# Patient Record
Sex: Male | Born: 1970 | Race: Black or African American | Hispanic: No | Marital: Single | Smoking: Current every day smoker
Health system: Southern US, Community
[De-identification: ages and names within clinical notes are randomized; demographics above are authoritative.]

## PROBLEM LIST (undated history)

## (undated) DIAGNOSIS — K219 Gastro-esophageal reflux disease without esophagitis: Secondary | ICD-10-CM

## (undated) DIAGNOSIS — Z8679 Personal history of other diseases of the circulatory system: Secondary | ICD-10-CM

## (undated) DIAGNOSIS — N189 Chronic kidney disease, unspecified: Secondary | ICD-10-CM

## (undated) DIAGNOSIS — I10 Essential (primary) hypertension: Secondary | ICD-10-CM

## (undated) DIAGNOSIS — M75122 Complete rotator cuff tear or rupture of left shoulder, not specified as traumatic: Secondary | ICD-10-CM

## (undated) DIAGNOSIS — IMO0001 Reserved for inherently not codable concepts without codable children: Secondary | ICD-10-CM

## (undated) HISTORY — PX: NO PAST SURGERIES: SHX2092

---

## 2014-11-09 ENCOUNTER — Emergency Department (HOSPITAL_BASED_OUTPATIENT_CLINIC_OR_DEPARTMENT_OTHER): Payer: Self-pay

## 2014-11-09 ENCOUNTER — Emergency Department (HOSPITAL_BASED_OUTPATIENT_CLINIC_OR_DEPARTMENT_OTHER)
Admission: EM | Admit: 2014-11-09 | Discharge: 2014-11-09 | Disposition: A | Discharge status: Home / Self Care | Payer: Self-pay | Attending: Emergency Medicine | Admitting: Emergency Medicine

## 2014-11-09 ENCOUNTER — Encounter (HOSPITAL_BASED_OUTPATIENT_CLINIC_OR_DEPARTMENT_OTHER): Payer: Self-pay | Admitting: *Deleted

## 2014-11-09 DIAGNOSIS — I10 Essential (primary) hypertension: Secondary | ICD-10-CM | POA: Insufficient documentation

## 2014-11-09 DIAGNOSIS — K219 Gastro-esophageal reflux disease without esophagitis: Secondary | ICD-10-CM | POA: Insufficient documentation

## 2014-11-09 DIAGNOSIS — Z79899 Other long term (current) drug therapy: Secondary | ICD-10-CM | POA: Insufficient documentation

## 2014-11-09 DIAGNOSIS — Y9289 Other specified places as the place of occurrence of the external cause: Secondary | ICD-10-CM | POA: Insufficient documentation

## 2014-11-09 DIAGNOSIS — T1490XA Injury, unspecified, initial encounter: Secondary | ICD-10-CM

## 2014-11-09 DIAGNOSIS — Y998 Other external cause status: Secondary | ICD-10-CM | POA: Insufficient documentation

## 2014-11-09 DIAGNOSIS — S62609A Fracture of unspecified phalanx of unspecified finger, initial encounter for closed fracture: Secondary | ICD-10-CM

## 2014-11-09 DIAGNOSIS — Z720 Tobacco use: Secondary | ICD-10-CM | POA: Insufficient documentation

## 2014-11-09 DIAGNOSIS — Y9389 Activity, other specified: Secondary | ICD-10-CM | POA: Insufficient documentation

## 2014-11-09 DIAGNOSIS — Z7982 Long term (current) use of aspirin: Secondary | ICD-10-CM | POA: Insufficient documentation

## 2014-11-09 DIAGNOSIS — S62613A Displaced fracture of proximal phalanx of left middle finger, initial encounter for closed fracture: Secondary | ICD-10-CM | POA: Insufficient documentation

## 2014-11-09 DIAGNOSIS — X58XXXA Exposure to other specified factors, initial encounter: Secondary | ICD-10-CM | POA: Insufficient documentation

## 2014-11-09 HISTORY — DX: Reserved for inherently not codable concepts without codable children: IMO0001

## 2014-11-09 HISTORY — DX: Gastro-esophageal reflux disease without esophagitis: K21.9

## 2014-11-09 HISTORY — DX: Essential (primary) hypertension: I10

## 2014-11-09 MED ORDER — KETOROLAC TROMETHAMINE 60 MG/2ML IM SOLN
60.0000 mg | Freq: Once | INTRAMUSCULAR | Status: AC
  Administered 2014-11-09: 60 mg via INTRAMUSCULAR
  Filled 2014-11-09: qty 2

## 2014-11-09 MED ORDER — OXYCODONE-ACETAMINOPHEN 5-325 MG PO TABS: 1.0000 | ORAL_TABLET | Freq: Four times a day (QID) | ORAL | Status: DC | PRN

## 2014-11-09 NOTE — ED Notes (Signed)
Returned from xray

## 2014-11-09 NOTE — Discharge Instructions (Signed)
Cast or Splint Care °Casts and splints support injured limbs and keep bones from moving while they heal.  °HOME CARE °· Keep the cast or splint uncovered during the drying period. °¨ A plaster cast can take 24 to 48 hours to dry. °¨ A fiberglass cast will dry in less than 1 hour. °· Do not rest the cast on anything harder than a pillow for 24 hours. °· Do not put weight on your injured limb. Do not put pressure on the cast. Wait for your doctor's approval. °· Keep the cast or splint dry. °¨ Cover the cast or splint with a plastic bag during baths or wet weather. °¨ If you have a cast over your chest and belly (trunk), take sponge baths until the cast is taken off. °¨ If your cast gets wet, dry it with a towel or blow dryer. Use the cool setting on the blow dryer. °· Keep your cast or splint clean. Wash a dirty cast with a damp cloth. °· Do not put any objects under your cast or splint. °· Do not scratch the skin under the cast with an object. If itching is a problem, use a blow dryer on a cool setting over the itchy area. °· Do not trim or cut your cast. °· Do not take out the padding from inside your cast. °· Exercise your joints near the cast as told by your doctor. °· Raise (elevate) your injured limb on 1 or 2 pillows for the first 1 to 3 days. °GET HELP IF: °· Your cast or splint cracks. °· Your cast or splint is too tight or too loose. °· You itch badly under the cast. °· Your cast gets wet or has a soft spot. °· You have a bad smell coming from the cast. °· You get an object stuck under the cast. °· Your skin around the cast becomes red or sore. °· You have new or more pain after the cast is put on. °GET HELP RIGHT AWAY IF: °· You have fluid leaking through the cast. °· You cannot move your fingers or toes. °· Your fingers or toes turn blue or white or are cool, painful, or puffy (swollen). °· You have tingling or lose feeling (numbness) around the injured area. °· You have bad pain or pressure under the  cast. °· You have trouble breathing or have shortness of breath. °· You have chest pain. °Document Released: 11/17/2010 Document Revised: 03/20/2013 Document Reviewed: 01/24/2013 °Exit[employer]® Patient Information ©2015 [employer]. This information is not intended to replace advice given to you by your health care provider. Make sure you discuss any questions you have with your health care provider. ° °

## 2014-11-09 NOTE — ED Notes (Signed)
Per Dr. Nicanor AlconPalumbo, i applied a metal finger splint. i first wrapped with kerlix, then splint, then secured with tape and coban.

## 2014-11-09 NOTE — ED Notes (Signed)
Patient states he was "horse playing" and hurt left hand approx. 1:30 a.m.

## 2014-11-09 NOTE — ED Provider Notes (Signed)
CSN: [id number]     Arrival date & time 11/09/14  0535 History   First MD Initiated Contact with Patient 11/09/14 [phone]     Chief Complaint  Patient presents with  . finger pain      (Consider location/radiation/quality/duration/timing/severity/associated sxs/prior Treatment) Patient is a 44 y.o. male presenting with hand pain. The history is provided by the patient.  Hand Pain This is a new problem. The current episode started 3 to 5 hours ago. The problem occurs constantly. The problem has not changed since onset.Pertinent negatives include no chest pain, no abdominal pain, no headaches and no shortness of breath. Nothing aggravates the symptoms. Nothing relieves the symptoms. He has tried nothing for the symptoms.  "Horse playing" at 130 am and had left middle finger pain and does not know what happened.    Past Medical History  Diagnosis Date  . Hypertension   . Reflux    History reviewed. No pertinent past surgical history. History reviewed. No pertinent family history. History  Substance Use Topics  . Smoking status: Current Every Day Smoker  . Smokeless tobacco: Not on file  . Alcohol Use: Yes     Comment: occasional     Review of Systems  Respiratory: Negative for shortness of breath.   Cardiovascular: Negative for chest pain.  Gastrointestinal: Negative for abdominal pain.  Neurological: Negative for headaches.  All other systems reviewed and are negative.     Allergies  Review of patient's allergies indicates no known allergies.  Home Medications   Prior to Admission medications   Medication Sig Start Date End Date Taking? Authorizing Provider  AmLODIPine Besylate (NORVASC PO) Take by mouth.   Yes Historical Provider, MD  aspirin 325 MG tablet Take 325 mg by mouth daily.   Yes Historical Provider, MD  HYDROCHLOROTHIAZIDE PO Take by mouth.   Yes Historical Provider, MD  Lansoprazole (PREVACID PO) Take by mouth.   Yes Historical Provider, MD   BP 163/110  mmHg  Pulse 79  Temp(Src) 98.1 F (36.7 C) (Oral)  Resp 20  Ht 5\' 7"  (1.702 m)  Wt 187 lb (84.823 kg)  BMI 29.28 kg/m2  SpO2 96% Physical Exam  Constitutional: He is oriented to person, place, and time. He appears well-developed and well-nourished. No distress.  Smells of ETOH  HENT:  Head: Normocephalic and atraumatic.  Mouth/Throat: Oropharynx is clear and moist.  Eyes: Conjunctivae are normal. Pupils are equal, round, and reactive to light.  Neck: Normal range of motion. Neck supple.  Cardiovascular: Normal rate, regular rhythm and intact distal pulses.   Pulmonary/Chest: Effort normal and breath sounds normal. No respiratory distress. He has no wheezes. He has no rales.  Abdominal: Soft. Bowel sounds are normal. There is no tenderness. There is no rebound and no guarding.  Musculoskeletal:       Left hand: He exhibits decreased range of motion and swelling. He exhibits normal two-point discrimination, normal capillary refill and no laceration. Normal sensation noted. Normal strength noted.  Neurological: He is alert and oriented to person, place, and time.  Skin: Skin is warm and dry.  Psychiatric: He has a normal mood and affect.    ED Course  Procedures (including critical care time) Labs Review Labs Reviewed - No data to display  Imaging Review No results found.   EKG Interpretation None      MDM   Final diagnoses:  Injury    650 am case d/w Dr. Mina MarbleWeingold of hand surgery.  Call office Monday to  be seen for repair consultation.     Patient informed of XRay findings and need to call Dr. Ronie Spies office Monday am and state EDP talked to Dr. Mina Marble and patient needs to be seen to decide on repair.  No alcohol while taking pain medication.  Patient and family verbalize understanding and agree to follow up    Maykel Reitter, MD 11/09/14 [phone]

## 2014-11-09 NOTE — ED Notes (Signed)
Transported to xray 

## 2014-11-09 NOTE — ED Notes (Addendum)
Pt states that he was "horseplaying" this morning at 0130am and hurt his left middle finger. Swelling noted to left middle finger.  Deformity noted to left middle finger. Ice pack given.  Denies any other injury. Denies any loc. Awaiting xray

## 2014-11-10 ENCOUNTER — Encounter (HOSPITAL_BASED_OUTPATIENT_CLINIC_OR_DEPARTMENT_OTHER): Payer: Self-pay | Admitting: *Deleted

## 2014-11-10 ENCOUNTER — Other Ambulatory Visit: Payer: Self-pay | Admitting: Orthopedic Surgery

## 2014-11-10 NOTE — Progress Notes (Signed)
Pt gets meds from health dept-no pcp-will come in for ekg-bmet May have OSA-not tested

## 2014-11-11 ENCOUNTER — Encounter (HOSPITAL_BASED_OUTPATIENT_CLINIC_OR_DEPARTMENT_OTHER)
Admission: RE | Admit: 2014-11-11 | Discharge: 2014-11-11 | Disposition: A | Discharge status: Home / Self Care | Payer: Self-pay | Source: Ambulatory Visit | Attending: Orthopedic Surgery | Admitting: Orthopedic Surgery

## 2014-11-11 ENCOUNTER — Other Ambulatory Visit: Payer: Self-pay

## 2014-11-11 DIAGNOSIS — Z01818 Encounter for other preprocedural examination: Secondary | ICD-10-CM | POA: Insufficient documentation

## 2014-11-11 LAB — BASIC METABOLIC PANEL
ANION GAP: 15 (ref 5–15)
CO2: 21 mmol/L (ref 19–32)
CREATININE: 1.37 mg/dL — AB (ref 0.50–1.35)
Calcium: 9.5 mg/dL (ref 8.4–10.5)
Chloride: 99 mmol/L (ref 96–112)
GFR, EST AFRICAN AMERICAN: 71 mL/min — AB (ref >90)
GFR, EST NON AFRICAN AMERICAN: 61 mL/min — AB (ref >90)
Glucose, Bld: 102 mg/dL — ABNORMAL HIGH (ref 70–99)
Potassium: 3.3 mmol/L — ABNORMAL LOW (ref 3.5–5.1)
Sodium: 135 mmol/L (ref 135–145)

## 2014-11-12 ENCOUNTER — Ambulatory Visit (HOSPITAL_BASED_OUTPATIENT_CLINIC_OR_DEPARTMENT_OTHER): Payer: Self-pay | Admitting: Anesthesiology

## 2014-11-12 ENCOUNTER — Encounter (HOSPITAL_BASED_OUTPATIENT_CLINIC_OR_DEPARTMENT_OTHER)
Admission: RE | Disposition: A | Discharge status: Home / Self Care | Payer: Self-pay | Source: Ambulatory Visit | Attending: Orthopedic Surgery

## 2014-11-12 ENCOUNTER — Encounter (HOSPITAL_BASED_OUTPATIENT_CLINIC_OR_DEPARTMENT_OTHER): Payer: Self-pay | Admitting: Certified Registered"

## 2014-11-12 ENCOUNTER — Ambulatory Visit (HOSPITAL_BASED_OUTPATIENT_CLINIC_OR_DEPARTMENT_OTHER)
Admission: RE | Admit: 2014-11-12 | Discharge: 2014-11-12 | Disposition: A | Discharge status: Home / Self Care | Payer: Self-pay | Source: Ambulatory Visit | Attending: Orthopedic Surgery | Admitting: Orthopedic Surgery

## 2014-11-12 DIAGNOSIS — X58XXXA Exposure to other specified factors, initial encounter: Secondary | ICD-10-CM | POA: Insufficient documentation

## 2014-11-12 DIAGNOSIS — I10 Essential (primary) hypertension: Secondary | ICD-10-CM | POA: Insufficient documentation

## 2014-11-12 DIAGNOSIS — Z7982 Long term (current) use of aspirin: Secondary | ICD-10-CM | POA: Insufficient documentation

## 2014-11-12 DIAGNOSIS — K219 Gastro-esophageal reflux disease without esophagitis: Secondary | ICD-10-CM | POA: Insufficient documentation

## 2014-11-12 DIAGNOSIS — Y999 Unspecified external cause status: Secondary | ICD-10-CM | POA: Insufficient documentation

## 2014-11-12 DIAGNOSIS — F17200 Nicotine dependence, unspecified, uncomplicated: Secondary | ICD-10-CM | POA: Insufficient documentation

## 2014-11-12 DIAGNOSIS — Y929 Unspecified place or not applicable: Secondary | ICD-10-CM | POA: Insufficient documentation

## 2014-11-12 DIAGNOSIS — S62613A Displaced fracture of proximal phalanx of left middle finger, initial encounter for closed fracture: Secondary | ICD-10-CM | POA: Insufficient documentation

## 2014-11-12 DIAGNOSIS — Z79891 Long term (current) use of opiate analgesic: Secondary | ICD-10-CM | POA: Insufficient documentation

## 2014-11-12 DIAGNOSIS — Z79899 Other long term (current) drug therapy: Secondary | ICD-10-CM | POA: Insufficient documentation

## 2014-11-12 DIAGNOSIS — Y939 Activity, unspecified: Secondary | ICD-10-CM | POA: Insufficient documentation

## 2014-11-12 HISTORY — PX: OPEN REDUCTION INTERNAL FIXATION (ORIF) PROXIMAL PHALANX: SHX6235

## 2014-11-12 HISTORY — DX: Gastro-esophageal reflux disease without esophagitis: K21.9

## 2014-11-12 LAB — POCT HEMOGLOBIN-HEMACUE: Hemoglobin: 15.3 g/dL (ref 13.0–17.0)

## 2014-11-12 SURGERY — OPEN REDUCTION INTERNAL FIXATION (ORIF) PROXIMAL PHALANX
Anesthesia: General | Site: Finger | Laterality: Left

## 2014-11-12 MED ORDER — OXYCODONE HCL 5 MG/5ML PO SOLN: 5.0000 mg | Freq: Once | ORAL | Status: AC | PRN

## 2014-11-12 MED ORDER — DEXAMETHASONE SODIUM PHOSPHATE 10 MG/ML IJ SOLN
INTRAMUSCULAR | Status: DC | PRN
  Administered 2014-11-12: 10 mg via INTRAVENOUS

## 2014-11-12 MED ORDER — PROMETHAZINE HCL 25 MG/ML IJ SOLN: 6.2500 mg | INTRAMUSCULAR | Status: DC | PRN

## 2014-11-12 MED ORDER — PROPOFOL 10 MG/ML IV BOLUS
INTRAVENOUS | Status: DC | PRN
  Administered 2014-11-12: 250 mg via INTRAVENOUS

## 2014-11-12 MED ORDER — MIDAZOLAM HCL 5 MG/5ML IJ SOLN
INTRAMUSCULAR | Status: DC | PRN
  Administered 2014-11-12: 2 mg via INTRAVENOUS

## 2014-11-12 MED ORDER — FENTANYL CITRATE 0.05 MG/ML IJ SOLN
50.0000 ug | INTRAMUSCULAR | Status: DC | PRN
  Administered 2014-11-12: 50 ug via INTRAVENOUS
  Administered 2014-11-12: 25 ug via INTRAVENOUS
  Administered 2014-11-12: 50 ug via INTRAVENOUS
  Administered 2014-11-12: 25 ug via INTRAVENOUS

## 2014-11-12 MED ORDER — LACTATED RINGERS IV SOLN
INTRAVENOUS | Status: DC
  Administered 2014-11-12 (×2): via INTRAVENOUS

## 2014-11-12 MED ORDER — ONDANSETRON HCL 4 MG/2ML IJ SOLN
INTRAMUSCULAR | Status: DC | PRN
  Administered 2014-11-12: 4 mg via INTRAVENOUS

## 2014-11-12 MED ORDER — LIDOCAINE HCL (CARDIAC) 20 MG/ML IV SOLN
INTRAVENOUS | Status: DC | PRN
  Administered 2014-11-12: 50 mg via INTRAVENOUS

## 2014-11-12 MED ORDER — CEFAZOLIN SODIUM-DEXTROSE 2-3 GM-% IV SOLR
INTRAVENOUS | Status: AC
  Filled 2014-11-12: qty 50

## 2014-11-12 MED ORDER — MIDAZOLAM HCL 2 MG/2ML IJ SOLN: 1.0000 mg | INTRAMUSCULAR | Status: DC | PRN

## 2014-11-12 MED ORDER — HYDROMORPHONE HCL 1 MG/ML IJ SOLN: 0.2500 mg | INTRAMUSCULAR | Status: DC | PRN

## 2014-11-12 MED ORDER — PROPOFOL 10 MG/ML IV EMUL
INTRAVENOUS | Status: AC
  Filled 2014-11-12: qty 50

## 2014-11-12 MED ORDER — OXYCODONE HCL 5 MG PO TABS
5.0000 mg | ORAL_TABLET | Freq: Once | ORAL | Status: AC | PRN
  Administered 2014-11-12: 5 mg via ORAL

## 2014-11-12 MED ORDER — BUPIVACAINE HCL (PF) 0.25 % IJ SOLN
INTRAMUSCULAR | Status: DC | PRN
  Administered 2014-11-12: 10 mL

## 2014-11-12 MED ORDER — FENTANYL CITRATE 0.05 MG/ML IJ SOLN
INTRAMUSCULAR | Status: AC
  Filled 2014-11-12: qty 6

## 2014-11-12 MED ORDER — BUPIVACAINE HCL (PF) 0.25 % IJ SOLN
INTRAMUSCULAR | Status: AC
  Filled 2014-11-12: qty 30

## 2014-11-12 MED ORDER — CHLORHEXIDINE GLUCONATE 4 % EX LIQD: 60.0000 mL | Freq: Once | CUTANEOUS | Status: DC

## 2014-11-12 MED ORDER — OXYCODONE HCL 5 MG PO TABS
ORAL_TABLET | ORAL | Status: AC
  Filled 2014-11-12: qty 1

## 2014-11-12 MED ORDER — MIDAZOLAM HCL 2 MG/2ML IJ SOLN
INTRAMUSCULAR | Status: AC
  Filled 2014-11-12: qty 2

## 2014-11-12 MED ORDER — OXYCODONE-ACETAMINOPHEN 5-325 MG PO TABS: 1.0000 | ORAL_TABLET | ORAL | Status: DC | PRN

## 2014-11-12 MED ORDER — CEFAZOLIN SODIUM-DEXTROSE 2-3 GM-% IV SOLR
2.0000 g | INTRAVENOUS | Status: AC
  Administered 2014-11-12: 2 g via INTRAVENOUS

## 2014-11-12 SURGICAL SUPPLY — 70 items
APL SKNCLS STERI-STRIP NONHPOA (GAUZE/BANDAGES/DRESSINGS)
BANDAGE ELASTIC 3 VELCRO ST LF (GAUZE/BANDAGES/DRESSINGS) ×3 IMPLANT
BANDAGE ELASTIC 4 VELCRO ST LF (GAUZE/BANDAGES/DRESSINGS) IMPLANT
BENZOIN TINCTURE PRP APPL 2/3 (GAUZE/BANDAGES/DRESSINGS) IMPLANT
BIT DRILL 1.1 (BIT) ×4
BIT DRILL 1.1MM (BIT) ×2
BIT DRILL 60X20X1.1XQC TMX (BIT) ×2 IMPLANT
BIT DRL 60X20X1.1XQC TMX (BIT) ×2
BLADE SURG 15 STRL LF DISP TIS (BLADE) ×1 IMPLANT
BLADE SURG 15 STRL SS (BLADE) ×3
BNDG CMPR 9X4 STRL LF SNTH (GAUZE/BANDAGES/DRESSINGS)
BNDG CMPR MD 5X2 ELC HKLP STRL (GAUZE/BANDAGES/DRESSINGS)
BNDG ELASTIC 2 VLCR STRL LF (GAUZE/BANDAGES/DRESSINGS) IMPLANT
BNDG ESMARK 4X9 LF (GAUZE/BANDAGES/DRESSINGS) IMPLANT
BNDG GAUZE ELAST 4 BULKY (GAUZE/BANDAGES/DRESSINGS) IMPLANT
CANISTER SUCT 1200ML W/VALVE (MISCELLANEOUS) IMPLANT
CLOSURE WOUND 1/2 X4 (GAUZE/BANDAGES/DRESSINGS)
CORDS BIPOLAR (ELECTRODE) IMPLANT
COVER BACK TABLE 60X90IN (DRAPES) ×3 IMPLANT
CUFF TOURNIQUET SINGLE 18IN (TOURNIQUET CUFF) IMPLANT
DECANTER SPIKE VIAL GLASS SM (MISCELLANEOUS) IMPLANT
DRAPE EXTREMITY T 121X128X90 (DRAPE) ×3 IMPLANT
DRAPE OEC MINIVIEW 54X84 (DRAPES) ×3 IMPLANT
DRAPE SURG 17X23 STRL (DRAPES) ×3 IMPLANT
DURAPREP 26ML APPLICATOR (WOUND CARE) ×3 IMPLANT
GAUZE SPONGE 4X4 12PLY STRL (GAUZE/BANDAGES/DRESSINGS) ×3 IMPLANT
GAUZE SPONGE 4X4 16PLY XRAY LF (GAUZE/BANDAGES/DRESSINGS) IMPLANT
GAUZE XEROFORM 1X8 LF (GAUZE/BANDAGES/DRESSINGS) IMPLANT
GLOVE BIO SURGEON STRL SZ 6.5 (GLOVE) ×2 IMPLANT
GLOVE BIO SURGEONS STRL SZ 6.5 (GLOVE) ×1
GLOVE BIOGEL PI IND STRL 7.0 (GLOVE) ×1 IMPLANT
GLOVE BIOGEL PI INDICATOR 7.0 (GLOVE) ×2
GLOVE SURG SYN 8.0 (GLOVE) ×6 IMPLANT
GOWN STRL REUS W/ TWL LRG LVL3 (GOWN DISPOSABLE) ×1 IMPLANT
GOWN STRL REUS W/TWL LRG LVL3 (GOWN DISPOSABLE) ×3
GOWN STRL REUS W/TWL XL LVL3 (GOWN DISPOSABLE) ×6 IMPLANT
NEEDLE HYPO 25X1 1.5 SAFETY (NEEDLE) IMPLANT
NS IRRIG 1000ML POUR BTL (IV SOLUTION) IMPLANT
PACK BASIN DAY SURGERY FS (CUSTOM PROCEDURE TRAY) ×3 IMPLANT
PAD CAST 3X4 CTTN HI CHSV (CAST SUPPLIES) ×1 IMPLANT
PAD CAST 4YDX4 CTTN HI CHSV (CAST SUPPLIES) IMPLANT
PADDING CAST ABS 4INX4YD NS (CAST SUPPLIES) ×2
PADDING CAST ABS COTTON 4X4 ST (CAST SUPPLIES) ×1 IMPLANT
PADDING CAST COTTON 3X4 STRL (CAST SUPPLIES) ×3
PADDING CAST COTTON 4X4 STRL (CAST SUPPLIES)
PADDING UNDERCAST 2 STRL (CAST SUPPLIES) ×2
PADDING UNDERCAST 2X4 STRL (CAST SUPPLIES) ×1 IMPLANT
SCREW 1.5X15MM (Screw) ×6 IMPLANT
SCREW 1.5X18MM (Screw) ×3 IMPLANT
SCREW BN 18X1.5XST NONLOCK (Screw) ×1 IMPLANT
SHEET MEDIUM DRAPE 40X70 STRL (DRAPES) ×3 IMPLANT
SPLINT PLASTER CAST XFAST 4X15 (CAST SUPPLIES) IMPLANT
SPLINT PLASTER XTRA FAST SET 4 (CAST SUPPLIES)
STOCKINETTE 4X48 STRL (DRAPES) ×3 IMPLANT
STRIP CLOSURE SKIN 1/2X4 (GAUZE/BANDAGES/DRESSINGS) IMPLANT
SUCTION FRAZIER TIP 10 FR DISP (SUCTIONS) IMPLANT
SUT ETHILON 4 0 PS 2 18 (SUTURE) IMPLANT
SUT ETHILON 5 0 PS 2 18 (SUTURE) IMPLANT
SUT MERSILENE 4 0 P 3 (SUTURE) IMPLANT
SUT VIC AB 4-0 P-3 18XBRD (SUTURE) IMPLANT
SUT VIC AB 4-0 P3 18 (SUTURE)
SUT VICRYL 4-0 PS2 18IN ABS (SUTURE) ×3 IMPLANT
SUT VICRYL RAPIDE 4-0 (SUTURE) IMPLANT
SUT VICRYL RAPIDE 4/0 PS 2 (SUTURE) IMPLANT
SYR BULB 3OZ (MISCELLANEOUS) IMPLANT
SYRINGE 10CC LL (SYRINGE) IMPLANT
TOWEL OR 17X24 6PK STRL BLUE (TOWEL DISPOSABLE) ×3 IMPLANT
TUBE CONNECTING 20'X1/4 (TUBING)
TUBE CONNECTING 20X1/4 (TUBING) IMPLANT
UNDERPAD 30X30 INCONTINENT (UNDERPADS AND DIAPERS) ×3 IMPLANT

## 2014-11-12 NOTE — Transfer of Care (Signed)
Immediate Anesthesia Transfer of Care Note  Patient: Joshua Farley  Procedure(s) Performed: Procedure(s): OPEN REDUCTION INTERNAL FIXATION (ORIF) LEFT LONG PROXIMAL PHALANX FRACTURE  (Left)  Patient Location: PACU  Anesthesia Type:General  Level of Consciousness: awake and patient cooperative  Airway & Oxygen Therapy: Patient Spontanous Breathing and Patient connected to face mask oxygen  Post-op Assessment: Report given to RN and Post -op Vital signs reviewed and stable  Post vital signs: Reviewed and stable  Last Vitals:  Filed Vitals:   11/12/14 0852  BP: 144/93  Pulse: 62  Temp: 36.7 C  Resp: 16    Complications: No apparent anesthesia complications

## 2014-11-12 NOTE — Anesthesia Postprocedure Evaluation (Signed)
  Anesthesia Post-op Note  [facility]  Procedure(s) Performed: Procedure(s): OPEN REDUCTION INTERNAL FIXATION (ORIF) LEFT LONG PROXIMAL PHALANX FRACTURE  (Left)  Patient Location: PACU  Anesthesia Type:General  Level of Consciousness: awake and alert   Airway and Oxygen Therapy: Patient Spontanous Breathing  Post-op Pain: mild  Post-op Assessment: Post-op Vital signs reviewed  Post-op Vital Signs: Reviewed  Last Vitals:  Filed Vitals:   11/12/14 1152  BP: 152/86  Pulse: 70  Temp: 36.4 C  Resp: 18    Complications: No apparent anesthesia complications

## 2014-11-12 NOTE — Anesthesia Procedure Notes (Addendum)
Procedure Name: LMA Insertion Date/Time: 11/12/2014 9:29 AM Performed by: Gaudencio Chesnut D Pre-anesthesia Checklist: Patient identified, Emergency Drugs available, Suction available and Patient being monitored Patient Re-evaluated:Patient Re-evaluated prior to inductionOxygen Delivery Method: Circle System Utilized Preoxygenation: Pre-oxygenation with 100% oxygen Intubation Type: IV induction Ventilation: Mask ventilation without difficulty LMA: LMA inserted LMA Size: 5.0 Number of attempts: 1 Airway Equipment and Method: Bite block Placement Confirmation: positive ETCO2 Tube secured with: Tape Dental Injury: Teeth and Oropharynx as per pre-operative assessment    Performed by: Jamaree Hosier D

## 2014-11-12 NOTE — Op Note (Signed)
See note [id number]

## 2014-11-12 NOTE — H&P (Signed)
Joshua Farley is an 44 y.o. male.   Chief Complaint: left long finger pain and deformity HPI: as above s/p left hand trauma with Xray positive for displaced proximal phalanx fracture  Past Medical History  Diagnosis Date  . Hypertension   . Reflux   . GERD (gastroesophageal reflux disease)     Past Surgical History  Procedure Laterality Date  . No past surgeries      History reviewed. No pertinent family history. Social History:  reports that he has been smoking.  He does not have any smokeless tobacco history on file. He reports that he drinks alcohol. He reports that he does not use illicit drugs.  Allergies: No Known Allergies  Medications Prior to Admission  Medication Sig Dispense Refill  . AmLODIPine Besylate (NORVASC PO) Take by mouth.    Marland Kitchen aspirin 325 MG tablet Take 325 mg by mouth daily.    Marland Kitchen HYDROCHLOROTHIAZIDE PO Take by mouth.    . Lansoprazole (PREVACID PO) Take by mouth.    . oxyCODONE-acetaminophen (PERCOCET) 5-325 MG per tablet Take 1 tablet by mouth every 6 (six) hours as needed. 17 tablet 0    Results for orders placed or performed during the hospital encounter of 11/12/14 (from the past 48 hour(s))  Basic metabolic panel     Status: Abnormal   Collection Time: 11/11/14  2:10 PM  Result Value Ref Range   Sodium 135 135 - 145 mmol/L   Potassium 3.3 (L) 3.5 - 5.1 mmol/L   Chloride 99 96 - 112 mmol/L   CO2 21 19 - 32 mmol/L   Glucose, Bld 102 (H) 70 - 99 mg/dL   BUN <5 (L) 6 - 23 mg/dL   Creatinine, Ser 1.37 (H) 0.50 - 1.35 mg/dL   Calcium 9.5 8.4 - 10.5 mg/dL   GFR calc non Af Amer 61 (L) >90 mL/min   GFR calc Af Amer 71 (L) >90 mL/min    Comment: (NOTE) The eGFR has been calculated using the CKD EPI equation. This calculation has not been validated in all clinical situations. eGFR's persistently <90 mL/min signify possible Chronic Kidney Disease.    Anion gap 15 5 - 15   No results found.  Review of Systems  All other systems reviewed and are  negative.   Blood pressure 144/93, pulse 62, temperature 98.1 F (36.7 C), temperature source Oral, resp. rate 16, height _0  (1.702 m), weight 86.807 kg (191 lb 6 oz), SpO2 98 %. Physical Exam  Constitutional: He is oriented to person, place, and time. He appears well-developed and well-nourished.  HENT:  Head: Normocephalic and atraumatic.  Cardiovascular: Normal rate.   Respiratory: Effort normal.  Musculoskeletal:       Left hand: He exhibits tenderness, bony tenderness and deformity.  Displaced left long proximal phalanx fracture  Neurological: He is alert and oriented to person, place, and time.  Skin: Skin is warm.  Psychiatric: He has a normal mood and affect. His behavior is normal. Judgment and thought content normal.     Assessment/Plan As above   Plan ORIF  Kelise Kuch A 11/12/2014, 9:11 AM

## 2014-11-12 NOTE — Discharge Instructions (Signed)

## 2014-11-12 NOTE — Anesthesia Preprocedure Evaluation (Addendum)
Anesthesia Evaluation  Patient identified by MRN, date of birth, ID band Patient awake    Reviewed: Allergy & Precautions, NPO status , Patient's Chart, lab work & pertinent test results  Airway Mallampati: I  TM Distance: >3 FB Neck ROM: Full    Dental  (+) Teeth Intact, Dental Advisory Given   Pulmonary Current Smoker,  breath sounds clear to auscultation        Cardiovascular hypertension, Pt. on medications Rhythm:Regular Rate:Normal     Neuro/Psych negative neurological ROS     GI/Hepatic Neg liver ROS, GERD-  ,  Endo/Other  negative endocrine ROS  Renal/GU Renal InsufficiencyRenal disease     Musculoskeletal   Abdominal   Peds  Hematology negative hematology ROS (+)   Anesthesia Other Findings   Reproductive/Obstetrics                          Anesthesia Physical Anesthesia Plan  ASA: II  Anesthesia Plan: General   Post-op Pain Management:    Induction: Intravenous  Airway Management Planned: LMA  Additional Equipment:   Intra-op Plan:   Post-operative Plan: Extubation in OR  Informed Consent: I have reviewed the patients History and Physical, chart, labs and discussed the procedure including the risks, benefits and alternatives for the proposed anesthesia with the patient or authorized representative who has indicated his/her understanding and acceptance.   Dental advisory given  Plan Discussed with: CRNA  Anesthesia Plan Comments:         Anesthesia Quick Evaluation

## 2014-11-13 ENCOUNTER — Encounter (HOSPITAL_BASED_OUTPATIENT_CLINIC_OR_DEPARTMENT_OTHER): Payer: Self-pay | Admitting: Orthopedic Surgery

## 2014-11-13 NOTE — Op Note (Signed)
NAME:  Joshua DaltonSPRINGS, Dasean              ACCOUNT NO.:  [id number]  MEDICAL RECORD NO.:  [id number]  LOCATION:                                 FACILITY:  PHYSICIAN:  Artist PaisMatthew A. Kalyssa Anker, M.D.DATE OF BIRTH:  1970-08-10  DATE OF PROCEDURE:  11/12/2014 DATE OF DISCHARGE:  11/12/2014                              OPERATIVE REPORT   PREOPERATIVE DIAGNOSIS:  Displaced left long finger proximal phalangeal fracture.  POSTOPERATIVE DIAGNOSIS:  Displaced left long finger proximal phalangeal fracture.  PROCEDURE:  Open reduction and internal fixation of above with three 1.5 mm screws.  SURGEON:  Artist PaisMatthew A. Mina MarbleWeingold, M.D.  ASSISTANT:  Jonni Sangerobert J. Dasnoit, P.A.  ANESTHESIA:  General.  COMPLICATIONS:  No complication.  DRAINS:  No drains.  DESCRIPTION OF PROCEDURE:  The patient was taken to the operating suite. After induction of general anesthetic, left upper extremity was prepped and draped in sterile fashion.  An Esmarch was used to exsanguinate the limb.  Tourniquet was inflated to 250 mmHg.  At this point in time, a C- shaped incision was made over the proximal phalanx of the left long finger and radially based flap was elevated.  Sutured with 4-0 nylon. We split the extensor mechanism midline, carefully elevated this radially and ulnarly.  We also did subperiosteal dissection of the fracture site.  Long spiral oblique fracture was debrided of clot. Reduction was performed with reduction clamp.  We then placed three 1.5 mm screws from ulnar to radial across the fracture site under direct and fluoroscopic guidance.  Adequate reduction was achieved both clinically and radiographically.  The wound was thoroughly irrigated.  The periosteum was closed with 4-0 Vicryl.  The extensor mechanism realigned with 3-0 FiberWire and the skin with 3-0 Prolene subcuticular stitch. Steri-Strips, 4 x 4s, fluffs, and a protective volar splint was applied. The patient tolerated the procedure well, went to  recovery room in stable fashion.     Artist PaisMatthew A. Mina MarbleWeingold, M.D.     MAW/MEDQ  D:  11/12/2014  T:  11/12/2014  Job:  [id number]

## 2014-11-17 ENCOUNTER — Encounter: Payer: Self-pay | Admitting: Occupational Therapy

## 2014-11-17 ENCOUNTER — Ambulatory Visit: Payer: Self-pay | Attending: Orthopedic Surgery | Admitting: Occupational Therapy

## 2014-11-17 DIAGNOSIS — M25642 Stiffness of left hand, not elsewhere classified: Secondary | ICD-10-CM | POA: Insufficient documentation

## 2014-11-17 NOTE — Therapy (Signed)
[facility] [address] [place], [place], [address] Phone: [phone]   Fax:  [phone]  Occupational Therapy Evaluation  Patient Details  Name: Joshua DaltonHarvey Gelinas MRN: [id number] Date of Birth: 05/22/1971 Referring Provider:  Dairl PonderWeingold, Matthew, MD  Encounter Date: 11/17/2014      OT End of Session - 11/17/14 1228    Visit Number 1   Authorization Type self pay   OT Start Time 1110   OT Stop Time 1220   OT Time Calculation (min) 70 min   Equipment Utilized During Treatment splint   Activity Tolerance Patient tolerated treatment well      Past Medical History  Diagnosis Date  . Hypertension   . Reflux   . GERD (gastroesophageal reflux disease)     Past Surgical History  Procedure Laterality Date  . No past surgeries    . Open reduction internal fixation (orif) proximal phalanx Left 11/12/2014    Procedure: OPEN REDUCTION INTERNAL FIXATION (ORIF) LEFT LONG PROXIMAL PHALANX FRACTURE ;  Surgeon: Dairl PonderMatthew Weingold, MD;  Location: [facility];  Service: Orthopedics;  Laterality: Left;    There were no vitals filed for this visit.  Visit Diagnosis:  Stiffness of joint, hand, left - Plan: Ot plan of care cert/re-cert      Subjective Assessment - 11/17/14 1213    Subjective  It really doesn't hurt   Pertinent History s/p ORIF Lt long finger d/t proximal phalanx fx on 11/12/14   Patient Stated Goals Get my hand better so I can start working   Currently in Pain? No/denies           [facility] Assessment - 11/17/14 1220    Assessment   Diagnosis s/p ORIF Lt long finger due to proximal phalax fx   Onset Date --  surgery: 11/12/14   Assessment Pt is Rt handed. Steri-strips over incision area with minimal edema. Pt arrived fully wrapped and protected with scheduled appt   Prior Therapy none   Precautions   Precautions Other (comment)  no movement Lt long finger at MP and PIP joint   Required Braces  or Orthoses Other Brace/Splint   Other Brace/Splint Orders fo static splint with MP and PIP joint at 30* flexion   Home  Environment   Lives With Significant other  who assists prn   Prior Function   Level of Independence Independent with basic ADLs;Independent with homemaking with ambulation   Vocation Unemployed  however had just been hired by [employer]   ADL   ADL comments Pt performing BADLS with Rt dominant hand, however requires min assist for bathing and dependent for tying shoes. Pt's girlfriend currently cooking and cleaning                  OT Treatments/Exercises (OP) - 11/17/14 0001    ADLs   ADL Comments Discussed hygiene care, splint wear and care, and precautions of hand with patient.    Splinting   Splinting Pt arrived fully wrapped and protected with digits 2-4 included in post surgical cast. Carefully removed soft cast and bandages. Therapist cleaned hand while keeping fingers immobolized. Fabricated and fitted static splint with MP's and PIP's of digits 2-4 in 30 degrees flexion per MD orders.                OT Education - 11/17/14 1209    Education provided Yes   Education Details splint wear and care, hygiene care   Person(s) Educated  Patient   Methods Explanation;Demonstration;Handout   Comprehension Verbalized understanding          OT Short Term Goals - 11/17/14 1234    OT SHORT TERM GOAL #1   Title Independent w/ splint wear and care    Time 4   Period Weeks   Status On-going  may need adjustments   OT SHORT TERM GOAL #2   Title Pt can verbalize understanding with splint wear and care, hygiene care, and precautions   Status Achieved           OT Long Term Goals - 11/17/14 1235    OT LONG TERM GOAL #1   Title Independent w/ HEP (if warranted by MD)   Time 8   Period Weeks   Status New   OT LONG TERM GOAL #2   Title Pt to have 90% or greater ROM Lt hand   Baseline dependent at this time d/t current precautions   Time  8   Period Weeks   Status New               Plan - 11/17/14 1230    Clinical Impression Statement Pt is a 44 y.o. male who presents to outpatient rehab fully wrapped and protected with scheduled appt. for splinting needs s/p ORIF Lt long finger due to proximal phalanx fx with surgery on 11/12/14. Pt presents with no pain.   Pt will benefit from skilled therapeutic intervention in order to improve on the following deficits (Retired) Increased edema;Decreased scar mobility;Impaired UE functional use;Decreased range of motion;Decreased strength   OT Frequency --  1-2x/week prn for splinting adjustments and follow up therapy if warranted by MD   OT Duration --  UP TO 8 weeks   OT Treatment/Interventions Self-care/ADL training;Electrical Stimulation;Therapeutic exercise;Moist Heat;Splinting;Fluidtherapy;Scar mobilization;Therapeutic exercises;Patient/family education;Contrast Bath;DME and/or AE instruction;Manual Therapy;Passive range of motion;Therapeutic activities   Plan splint check and adjustments prn. Pt also sees MD later this afternoon for f/u appointment.    Consulted and Agree with Plan of Care Patient        Problem List There are no active problems to display for this patient.   Kelli Churn, OTR/L 11/17/2014, 12:41 PM   [facility] [address] [place], [place], [address] Phone: [phone]   Fax:  [phone]

## 2014-11-17 NOTE — Patient Instructions (Signed)
WEARING SCHEDULE:  Wear splint at ALL times except for hygiene care one time per day  PURPOSE:  To prevent movement and for protection until injury can heal  CARE OF SPLINT:  Keep splint away from heat sources including: stove, radiator or furnace, or a car in sunlight. The splint can melt and will no longer fit you properly  Keep away from pets and children  Clean the splint with rubbing alcohol 1-2 times per day.  * During this time, make sure you also clean your hand/arm as instructed by your therapist and/or perform dressing changes as needed. Then dry hand/arm completely before replacing splint. (When cleaning hand/arm, keep it immobilized in same position until splint is replaced)  PRECAUTIONS/POTENTIAL PROBLEMS: *If you notice or experience increased pain, swelling, numbness, or a lingering reddened area from the splint: Contact your therapist immediately by calling [phone]. You must wear the splint for protection, but we will get you scheduled for adjustments as quickly as possible.  (If only straps or hooks need to be replaced and NO adjustments to the splint need to be made, just call the office ahead and let them know you are coming in)  If you have any medical concerns or signs of infection, please call your doctor immediately

## 2014-11-25 ENCOUNTER — Encounter: Payer: Self-pay | Admitting: Occupational Therapy

## 2014-11-25 ENCOUNTER — Ambulatory Visit: Payer: Self-pay | Admitting: Occupational Therapy

## 2014-11-25 DIAGNOSIS — M25642 Stiffness of left hand, not elsewhere classified: Secondary | ICD-10-CM

## 2014-11-25 NOTE — Therapy (Signed)
[facility] [address] [place], [place], [address] Phone: [phone]   Fax:  [phone]  Occupational Therapy Treatment  Patient Details  Name: Joshua Farley MRN: [id number] Date of Birth: June 09, 1971 Referring Provider:  Charlotte Crumb, MD  Encounter Date: 11/25/2014      OT End of Session - 11/25/14 1101    Visit Number 2   Authorization Type self pay   OT Start Time 0940   OT Stop Time 1005   OT Time Calculation (min) 25 min   Activity Tolerance Patient tolerated treatment well      Past Medical History  Diagnosis Date  . Hypertension   . Reflux   . GERD (gastroesophageal reflux disease)     Past Surgical History  Procedure Laterality Date  . No past surgeries    . Open reduction internal fixation (orif) proximal phalanx Left 11/12/2014    Procedure: OPEN REDUCTION INTERNAL FIXATION (ORIF) LEFT LONG PROXIMAL PHALANX FRACTURE ;  Surgeon: Charlotte Crumb, MD;  Location: [facility];  Service: Orthopedics;  Laterality: Left;    There were no vitals filed for this visit.  Visit Diagnosis:  Stiffness of joint, hand, left      Subjective Assessment - 11/25/14 0950    Subjective  "The Dr. said I could have the splint cut down to just include the long finger or just keep it like it is. He told me I could start weaning from the splint and start moving my fingers"   Pertinent History s/p ORIF Lt long finger d/t proximal phalanx fx on 11/12/14   Patient Stated Goals Get my hand better so I can start working   Currently in Pain? No/denies                      OT Treatments/Exercises (OP) - 11/25/14 1006    Exercises   Exercises Hand   Hand Exercises   Other Hand Exercises Pt issued A/ROM HEP for Lt hand for PIP flex, MP flex, and composite flex but instructed NOT to perform until MD (Dr. Burney Gauze) clears him after his appt with MD today; pt agreed. Pt to have repeat x-rays and  stitches removed. Pt return demo with Rt univolved hand   Splinting   Splinting Added new strap and provided new stockinette. Assessed splint - still fitting well. Offered to adjust/cut down to free index and ring finger (so only long finger included) per ok from MD, however pt politely declined and said it was fine since he was weaning from the splint anyway                OT Education - 11/25/14 0958    Education Details A/ROM HEP for Lt hand/fingers (cautioned NOT to perform until he sees MD for appt today and given clearance by Dr. Burney Gauze)   Person(s) Educated Patient   Methods Explanation;Demonstration;Handout   Comprehension Verbalized understanding  return demo with Rt uninvolved hand          OT Short Term Goals - 11/25/14 1102    OT SHORT TERM GOAL #1   Title Independent w/ splint wear and care    Time 4   Period Weeks   Status Achieved  met as of 11/24/17   OT SHORT TERM GOAL #2   Title Pt can verbalize understanding with splint wear and care, hygiene care, and precautions   Status Achieved           OT  Long Term Goals - 11/25/14 1102    OT LONG TERM GOAL #1   Title Independent w/ HEP (if warranted by MD)   Time 8   Period Weeks   Status On-going  issued - awaiting MD clearance after MD appt today (11/24/17)   OT LONG TERM GOAL #2   Title Pt to have 90% or greater ROM Lt hand   Baseline dependent at this time d/t current precautions   Time 8   Period Weeks   Status New               Plan - 11/25/14 1103    Clinical Impression Statement Pt independent w/ splint wear and care and met all STG's. Pt approximating LTG #1.    Plan To return only prn if MD wants continued therapy for this episode of care   Consulted and Agree with Plan of Care Patient        Problem List There are no active problems to display for this patient.   Joshua Farley, OTR/L 11/25/2014, 11:05 AM  [facility] [address] [place], [place], [address] Phone: [phone]   Fax:  [phone]

## 2014-11-25 NOTE — Patient Instructions (Signed)
*  Do NOT perform until Dr. Mina MarbleWeingold says OK to start (after your appointment today)    Flexor Tendon Gliding (Active Hook Fist)   With fingers and knuckles straight, bend middle and tip joints. Do not bend large knuckles. Repeat _10-15___ times. Do _4-6___ sessions per day.  MP Flexion (Active)   With back of hand on table, bend large knuckles as far as they will go, keeping smaller finger joints straight. Repeat _10-15___ times. Do __4-6__ sessions per day.      Finger Flexion / Extension    Bend fingers of left hand toward palm, making a  fist. Straighten fingers, opening fist. Repeat sequence _10-15___ times per session. Do _4-6__ sessions per day. Hand Variation: Palm down   Copyright  VHI. All rights reserved.

## 2015-02-13 ENCOUNTER — Emergency Department (HOSPITAL_COMMUNITY): Payer: Self-pay

## 2015-02-13 ENCOUNTER — Encounter (HOSPITAL_COMMUNITY): Payer: Self-pay | Admitting: Emergency Medicine

## 2015-02-13 ENCOUNTER — Emergency Department (HOSPITAL_COMMUNITY)
Admission: EM | Admit: 2015-02-13 | Discharge: 2015-02-13 | Disposition: A | Discharge status: Home / Self Care | Payer: Self-pay | Attending: Emergency Medicine | Admitting: Emergency Medicine

## 2015-02-13 DIAGNOSIS — I10 Essential (primary) hypertension: Secondary | ICD-10-CM | POA: Insufficient documentation

## 2015-02-13 DIAGNOSIS — F1410 Cocaine abuse, uncomplicated: Secondary | ICD-10-CM | POA: Insufficient documentation

## 2015-02-13 DIAGNOSIS — Z7982 Long term (current) use of aspirin: Secondary | ICD-10-CM | POA: Insufficient documentation

## 2015-02-13 DIAGNOSIS — Z8719 Personal history of other diseases of the digestive system: Secondary | ICD-10-CM | POA: Insufficient documentation

## 2015-02-13 DIAGNOSIS — F1310 Sedative, hypnotic or anxiolytic abuse, uncomplicated: Secondary | ICD-10-CM | POA: Insufficient documentation

## 2015-02-13 DIAGNOSIS — Z720 Tobacco use: Secondary | ICD-10-CM | POA: Insufficient documentation

## 2015-02-13 LAB — I-STAT CHEM 8, ED
BUN: 7 mg/dL (ref 6–20)
CHLORIDE: 104 mmol/L (ref 101–111)
CREATININE: 1.4 mg/dL — AB (ref 0.61–1.24)
Calcium, Ion: 1.08 mmol/L — ABNORMAL LOW (ref 1.12–1.23)
Glucose, Bld: 108 mg/dL — ABNORMAL HIGH (ref 65–99)
HEMATOCRIT: 47 % (ref 39.0–52.0)
Hemoglobin: 16 g/dL (ref 13.0–17.0)
Potassium: 2.9 mmol/L — ABNORMAL LOW (ref 3.5–5.1)
SODIUM: 140 mmol/L (ref 135–145)
TCO2: 18 mmol/L (ref 0–100)

## 2015-02-13 LAB — CBC WITH DIFFERENTIAL/PLATELET
BASOS PCT: 0 % (ref 0–1)
Basophils Absolute: 0 10*3/uL (ref 0.0–0.1)
Eosinophils Absolute: 0 10*3/uL (ref 0.0–0.7)
Eosinophils Relative: 0 % (ref 0–5)
HCT: 42.7 % (ref 39.0–52.0)
Hemoglobin: 14.3 g/dL (ref 13.0–17.0)
Lymphocytes Relative: 7 % — ABNORMAL LOW (ref 12–46)
Lymphs Abs: 0.8 10*3/uL (ref 0.7–4.0)
MCH: 31.2 pg (ref 26.0–34.0)
MCHC: 33.5 g/dL (ref 30.0–36.0)
MCV: 93 fL (ref 78.0–100.0)
MONO ABS: 0.9 10*3/uL (ref 0.1–1.0)
MONOS PCT: 7 % (ref 3–12)
Neutro Abs: 10.6 10*3/uL — ABNORMAL HIGH (ref 1.7–7.7)
Neutrophils Relative %: 86 % — ABNORMAL HIGH (ref 43–77)
PLATELETS: 344 10*3/uL (ref 150–400)
RBC: 4.59 MIL/uL (ref 4.22–5.81)
RDW: 15.6 % — ABNORMAL HIGH (ref 11.5–15.5)
WBC: 12.4 10*3/uL — ABNORMAL HIGH (ref 4.0–10.5)

## 2015-02-13 LAB — SALICYLATE LEVEL

## 2015-02-13 LAB — ETHANOL: ALCOHOL ETHYL (B): 6 mg/dL — AB (ref <5)

## 2015-02-13 LAB — RAPID URINE DRUG SCREEN, HOSP PERFORMED
AMPHETAMINES: NOT DETECTED
Barbiturates: NOT DETECTED
Benzodiazepines: POSITIVE — AB
COCAINE: POSITIVE — AB
Opiates: NOT DETECTED
Tetrahydrocannabinol: NOT DETECTED

## 2015-02-13 LAB — ACETAMINOPHEN LEVEL

## 2015-02-13 MED ORDER — POTASSIUM CHLORIDE 10 MEQ/100ML IV SOLN: 10.0000 meq | INTRAVENOUS | Status: DC

## 2015-02-13 MED ORDER — ONDANSETRON HCL 4 MG/2ML IJ SOLN
4.0000 mg | Freq: Once | INTRAMUSCULAR | Status: AC
  Administered 2015-02-13: 4 mg via INTRAVENOUS
  Filled 2015-02-13: qty 2

## 2015-02-13 MED ORDER — POTASSIUM CHLORIDE CRYS ER 20 MEQ PO TBCR
40.0000 meq | EXTENDED_RELEASE_TABLET | Freq: Once | ORAL | Status: AC
  Administered 2015-02-13: 40 meq via ORAL
  Filled 2015-02-13: qty 2

## 2015-02-13 NOTE — ED Notes (Signed)
Lab at bedside

## 2015-02-13 NOTE — Discharge Instructions (Signed)
Stimulant Use Disorder-Cocaine °Cocaine is one of a group of powerful drugs called stimulants. Cocaine has medical uses for stopping nosebleeds and for pain control before minor nose or dental surgery. However, cocaine is misused because of the effects that it produces. These effects include:  °· A feeling of extreme pleasure. °· Alertness. °· High energy. °Common street names for cocaine include coke, crack, blow, snow, and nose candy. Cocaine is snorted, dissolved in water and injected, or smoked.  °Stimulants are addictive because they activate regions of the brain that produce both the pleasurable sensation of "reward" and psychological dependence. Together, these actions account for loss of control and the rapid development of drug dependence. This means you become ill without the drug (withdrawal) and need to keep using it to function.  °Stimulant use disorder is use of stimulants that disrupts your daily life. It disrupts relationships with family and friends and how you do your job. Cocaine increases your blood pressure and heart rate. It can cause a heart attack or stroke. Cocaine can also cause death from irregular heart rate or seizures. °SYMPTOMS °Symptoms of stimulant use disorder with cocaine include: °· Use of cocaine in larger amounts or over a longer period of time than intended. °· Unsuccessful attempts to cut down or control cocaine use. °· A lot of time spent obtaining, using, or recovering from the effects of cocaine. °· A strong desire or urge to use cocaine (craving). °· Continued use of cocaine in spite of major problems at work, school, or home because of use. °· Continued use of cocaine in spite of relationship problems because of use. °· Giving up or cutting down on important life activities because of cocaine use. °· Use of cocaine over and over in situations when it is physically hazardous, such as driving a car. °· Continued use of cocaine in spite of a physical problem that is likely  related to use. Physical problems can include: °¨ Malnutrition. °¨ Nosebleeds. °¨ Chest pain. °¨ High blood pressure. °¨ A hole that develops between the part of your nose that separates your nostrils (perforated nasal septum). °¨ Lung and kidney damage. °· Continued use of cocaine in spite of a mental problem that is likely related to use. Mental problems can include: °¨ Schizophrenia-like symptoms. °¨ Depression. °¨ Bipolar mood swings. °¨ Anxiety. °¨ Sleep problems. °· Need to use more and more cocaine to get the same effect, or lessened effect over time with use of the same amount of cocaine (tolerance). °· Having withdrawal symptoms when cocaine use is stopped, or using cocaine to reduce or avoid withdrawal symptoms. Withdrawal symptoms include: °¨ Depressed or irritable mood. °¨ Low energy or restlessness. °¨ Bad dreams. °¨ Poor or excessive sleep. °¨ Increased appetite. °DIAGNOSIS °Stimulant use disorder is diagnosed by your health care provider. You may be asked questions about your cocaine use and how it affects your life. A physical exam may be done. A drug screen may be ordered. You may be referred to a mental health professional. The diagnosis of stimulant use disorder requires at least two symptoms within 12 months. The type of stimulant use disorder depends on the number of signs and symptoms you have. The type may be: °· Mild. Two or three signs and symptoms. °· Moderate. Four or five signs and symptoms. °· Severe. Six or more signs and symptoms. °TREATMENT °Treatment for stimulant use disorder is usually provided by mental health professionals with training in substance use disorders. The following options are available: °·   Counseling or talk therapy. Talk therapy addresses the reasons you use cocaine and ways to keep you from using again. Goals of talk therapy include: °¨ Identifying and avoiding triggers for use. °¨ Handling cravings. °¨ Replacing use with healthy activities. °· Support groups.  Support groups provide emotional support, advice, and guidance. °· Medicine. Certain medicines may decrease cocaine cravings or withdrawal symptoms. °HOME CARE INSTRUCTIONS °· Take medicines only as directed by your health care provider. °· Identify the people and activities that trigger your cocaine use and avoid them. °· Keep all follow-up visits as directed by your health care provider. °SEEK MEDICAL CARE IF: °· Your symptoms get worse or you relapse. °· You are not able to take medicines as directed. °SEEK IMMEDIATE MEDICAL CARE IF: °· You have serious thoughts about hurting yourself or others. °· You have a seizure, chest pain, sudden weakness, or loss of speech or vision. °FOR MORE INFORMATION °· National Institute on Drug Abuse: www.drugabuse.gov °· Substance Abuse and Mental Health Services Administration: www.samhsa.gov °Document Released: 07/15/2000 Document Revised: 12/02/2013 Document Reviewed: 07/31/2013 °ExitCare® Patient Information ©2015 [employer]. This information is not intended to replace advice given to you by your health care provider. Make sure you discuss any questions you have with your health care provider. ° °

## 2015-02-13 NOTE — ED Notes (Signed)
Bed: WA17 Expected date:  Expected time:  Means of arrival:  Comments: EMS/agitation

## 2015-02-13 NOTE — ED Notes (Addendum)
Per EMS, patient was found in a field at [address]/ [address]. Patient was staying at hotel nearby and ran out of the hotel in his boxer shorts yelling "They're gonna kill me". Patient was aggressive with EMS and arrives handcuffed to stretcher, accompanied by [employer]. Patient given 2.5mg  Versed IM and 5mg  Haldol IM en route with EMS. Patient reports having had 2- 22 oz beers and "2 cigarettes of cocaine".

## 2015-02-13 NOTE — ED Provider Notes (Signed)
CSN: [id number]     Arrival date & time 02/13/15  4098 History   First MD Initiated Contact with Patient 02/13/15 0531     Chief Complaint  Patient presents with  . Aggressive Behavior     (Consider location/radiation/quality/duration/timing/severity/associated sxs/prior Treatment) Patient is a 44 y.o. male presenting with drug problem. The history is provided by the patient and the police.  Drug Problem This is a chronic problem. The current episode started more than 1 week ago. The problem occurs constantly. The problem has been gradually worsening. Pertinent negatives include no chest pain, no abdominal pain, no headaches and no shortness of breath. Nothing aggravates the symptoms. Nothing relieves the symptoms. He has tried nothing for the symptoms. The treatment provided no relief.    Past Medical History  Diagnosis Date  . Hypertension   . Reflux   . GERD (gastroesophageal reflux disease)    Past Surgical History  Procedure Laterality Date  . No past surgeries    . Open reduction internal fixation (orif) proximal phalanx Left 11/12/2014    Procedure: OPEN REDUCTION INTERNAL FIXATION (ORIF) LEFT LONG PROXIMAL PHALANX FRACTURE ;  Surgeon: Dairl Ponder, MD;  Location: [facility];  Service: Orthopedics;  Laterality: Left;   History reviewed. No pertinent family history. History  Substance Use Topics  . Smoking status: Current Every Day Smoker -- 0.50 packs/day  . Smokeless tobacco: Not on file  . Alcohol Use: Yes     Comment: occasional     Review of Systems  Respiratory: Negative for shortness of breath.   Cardiovascular: Negative for chest pain.  Gastrointestinal: Negative for abdominal pain.  Neurological: Negative for headaches.  All other systems reviewed and are negative.     Allergies  Review of patient's allergies indicates no known allergies.  Home Medications   Prior to Admission medications   Medication Sig Start Date End Date Taking?  Authorizing Provider  AmLODIPine Besylate (NORVASC PO) Take by mouth.    Historical Provider, MD  aspirin 325 MG tablet Take 325 mg by mouth daily.    Historical Provider, MD  HYDROCHLOROTHIAZIDE PO Take by mouth.    Historical Provider, MD  Lansoprazole (PREVACID PO) Take by mouth.    Historical Provider, MD  oxyCODONE-acetaminophen (PERCOCET) 5-325 MG per tablet Take 1 tablet by mouth every 6 (six) hours as needed. Patient not taking: Reported on 11/17/2014 11/09/14   Tova Vater, MD  oxyCODONE-acetaminophen (ROXICET) 5-325 MG per tablet Take 1 tablet by mouth every 4 (four) hours as needed for severe pain. Patient not taking: Reported on 11/17/2014 11/12/14   Dairl Ponder, MD   BP 133/80 mmHg  Pulse 94  Temp(Src) 98.5 F (36.9 C) (Oral)  Resp 15  SpO2 99% Physical Exam  Constitutional: He is oriented to person, place, and time. He appears well-developed and well-nourished. No distress.  HENT:  Head: Normocephalic and atraumatic.  Mouth/Throat: Oropharynx is clear and moist.  Eyes: Conjunctivae are normal. Pupils are equal, round, and reactive to light.  Neck: Normal range of motion. Neck supple.  Cardiovascular: Normal rate, regular rhythm and intact distal pulses.   Pulmonary/Chest: Effort normal and breath sounds normal. No respiratory distress. He has no wheezes. He has no rales.  Abdominal: Soft. Bowel sounds are normal. There is no tenderness. There is no rebound and no guarding.  Musculoskeletal: Normal range of motion.  Neurological: He is alert and oriented to person, place, and time.  Skin: Skin is warm and dry.  Psychiatric: He has  a normal mood and affect.    ED Course  Procedures (including critical care time) Labs Review Labs Reviewed  CBC WITH DIFFERENTIAL/PLATELET - Abnormal; Notable for the following:    WBC 12.4 (*)    RDW 15.6 (*)    Neutrophils Relative % 86 (*)    Neutro Abs 10.6 (*)    Lymphocytes Relative 7 (*)    All other components within normal  limits  ETHANOL - Abnormal; Notable for the following:    Alcohol, Ethyl (B) 6 (*)    All other components within normal limits  ACETAMINOPHEN LEVEL - Abnormal; Notable for the following:    Acetaminophen (Tylenol), Serum <10 (*)    All other components within normal limits  I-STAT CHEM 8, ED - Abnormal; Notable for the following:    Potassium 2.9 (*)    Creatinine, Ser 1.40 (*)    Glucose, Bld 108 (*)    Calcium, Ion 1.08 (*)    All other components within normal limits  SALICYLATE LEVEL  URINE RAPID DRUG SCREEN, HOSP PERFORMED    Imaging Review Dg Chest Portable 1 View  02/13/2015   CLINICAL DATA:  Found in field in boxer shorts, altered mental status. History of hypertension.  EXAM: PORTABLE CHEST - 1 VIEW  COMPARISON:  None.  FINDINGS: The heart size and mediastinal contours are within normal limits. Both lungs are clear. The visualized skeletal structures are unremarkable.  IMPRESSION: No active disease.   Electronically Signed   By: Awilda Metroourtnay  Bloomer M.D.   On: 02/13/2015 05:52     EKG Interpretation   Date/Time:  Friday February 13 2015 05:28:58 EDT Ventricular Rate:  120 PR Interval:  162 QRS Duration: 92 QT Interval:  334 QTC Calculation: 472 R Axis:   105 Text Interpretation:  Sinus tachycardia Confirmed by [facility]  MD,  Airam Heidecker ([id number]) on 02/13/2015 7:59:03 AM      MDM   Final diagnoses:  None   Patient admits to 2 rocks of crack and 2 32 oz beers, he is already calm and sleepy on arrival from the versed and haldol he was given by EMS.  Signed out to Dr. Madilyn Hookees pending arousal.      Cy BlamerApril Azilee Pirro, MD 02/13/15 [phone]

## 2015-02-13 NOTE — ED Notes (Signed)
XR requested for stat XR

## 2016-05-30 IMAGING — CR DG HAND COMPLETE 3+V*L*
3 series · 3 of 3 positions shown · non-contrast
Comparison: None.

CLINICAL DATA: Pain disfigurement to the proximal phalanx of the
left middle finger.

EXAM:
LEFT HAND - COMPLETE 3+ VIEW

[x hand pa left]
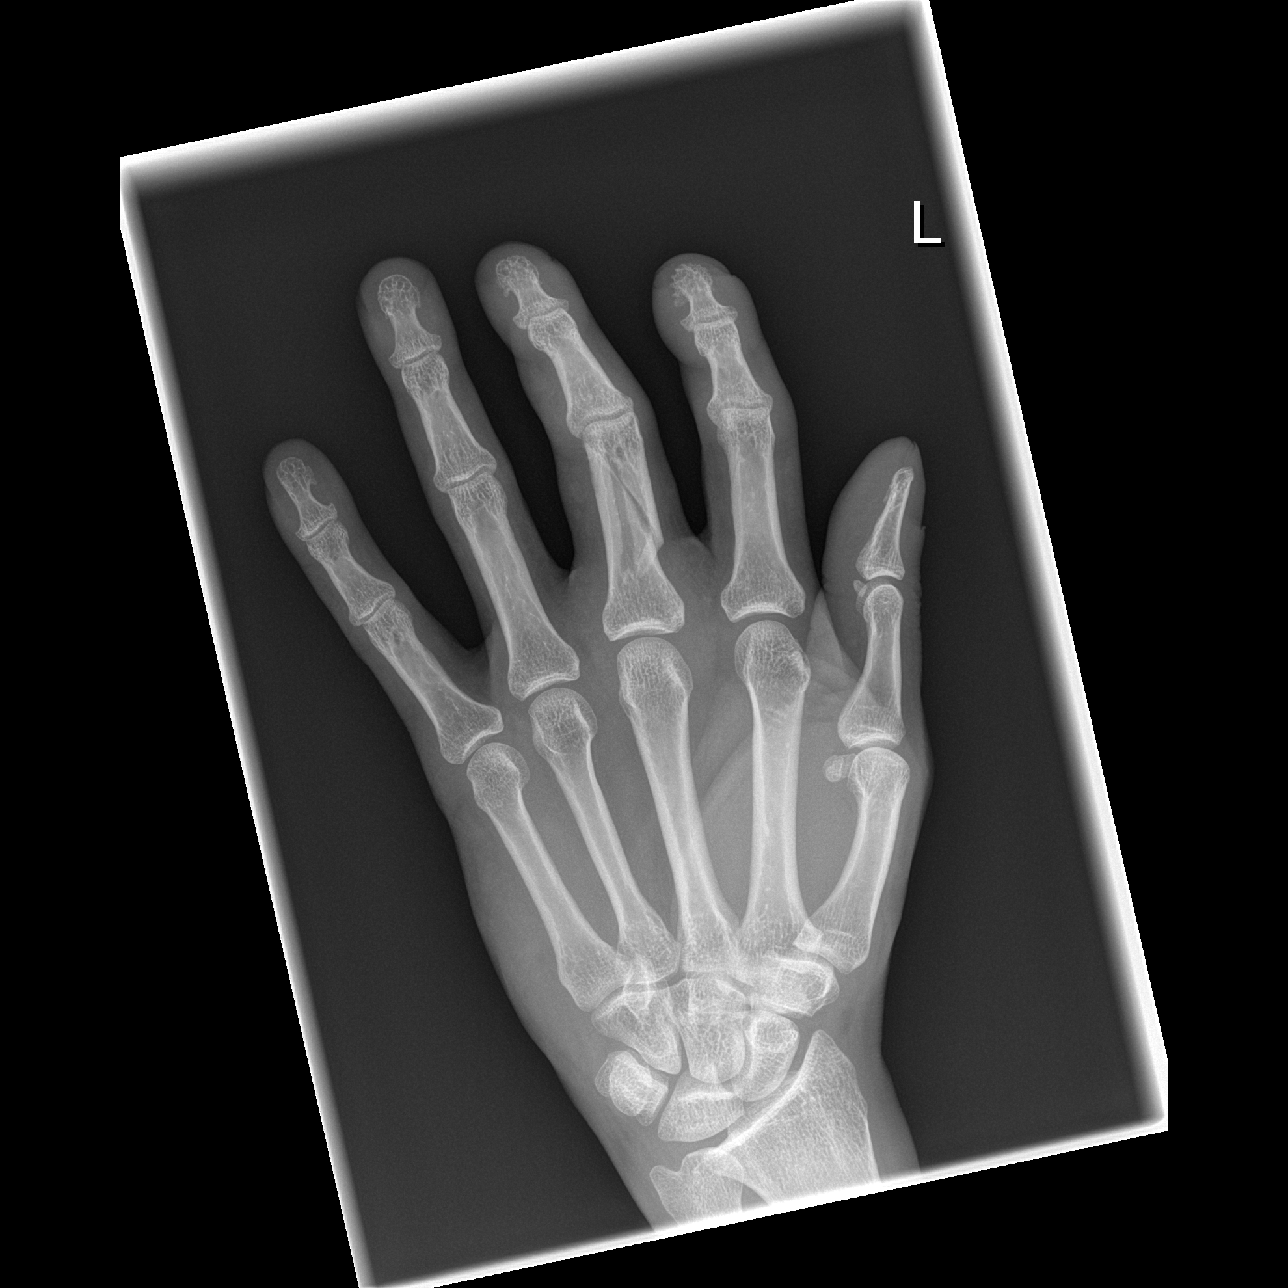

[x hand oblique left]
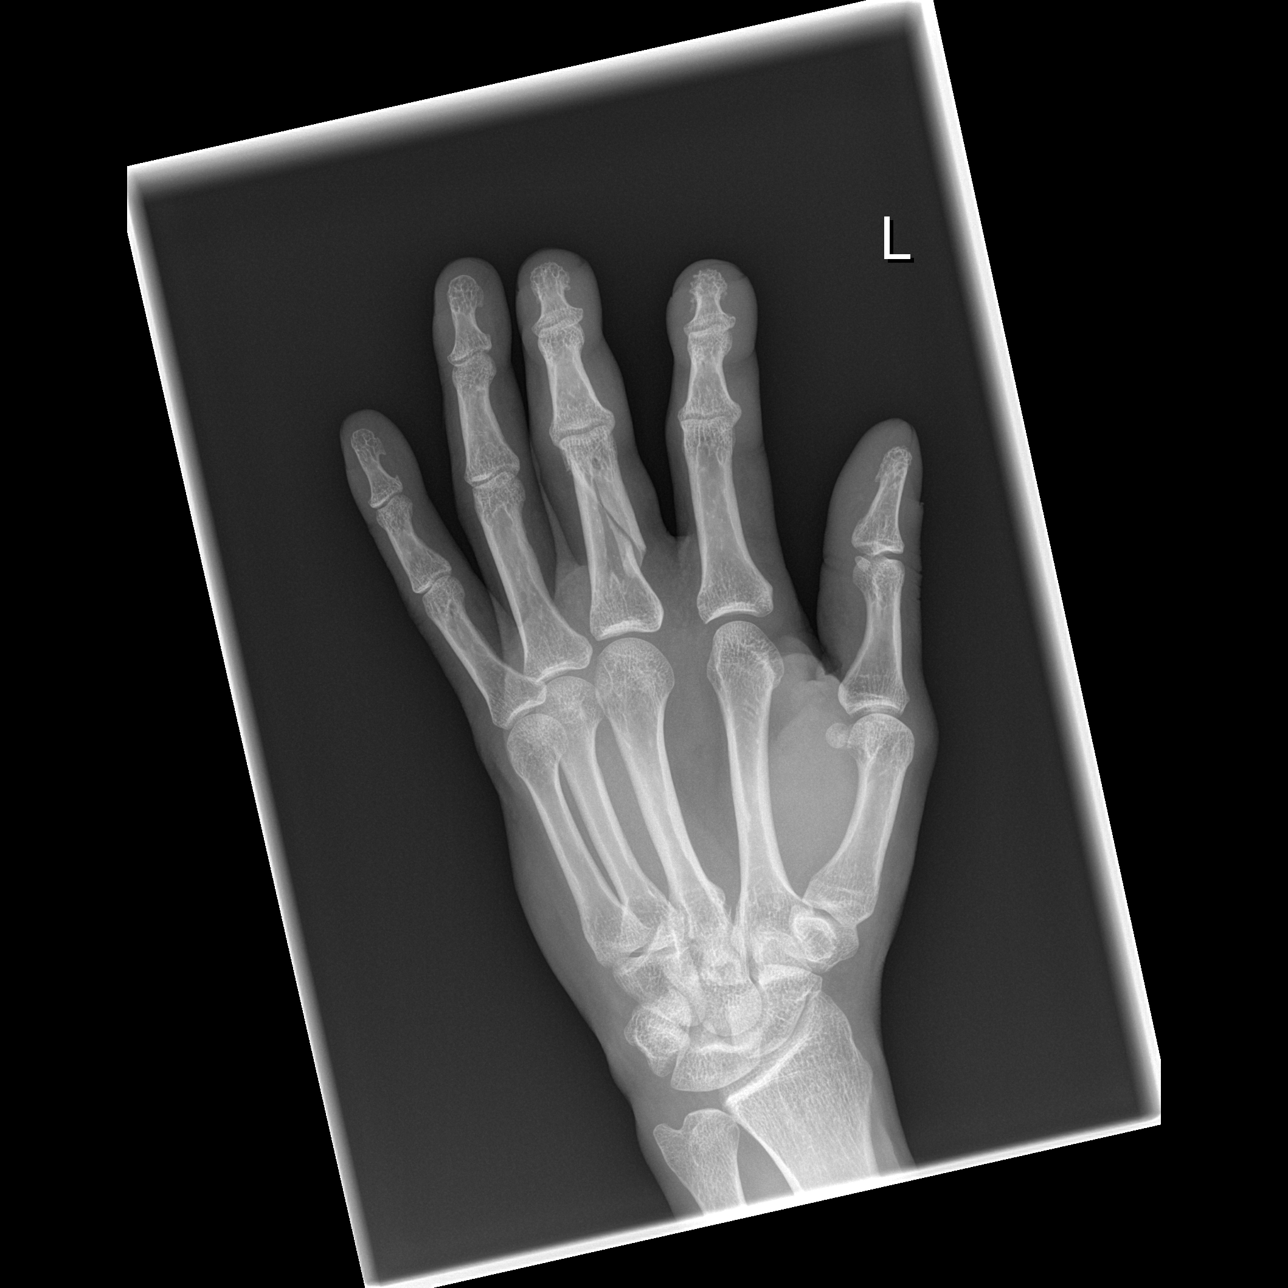

[x hand lat left]
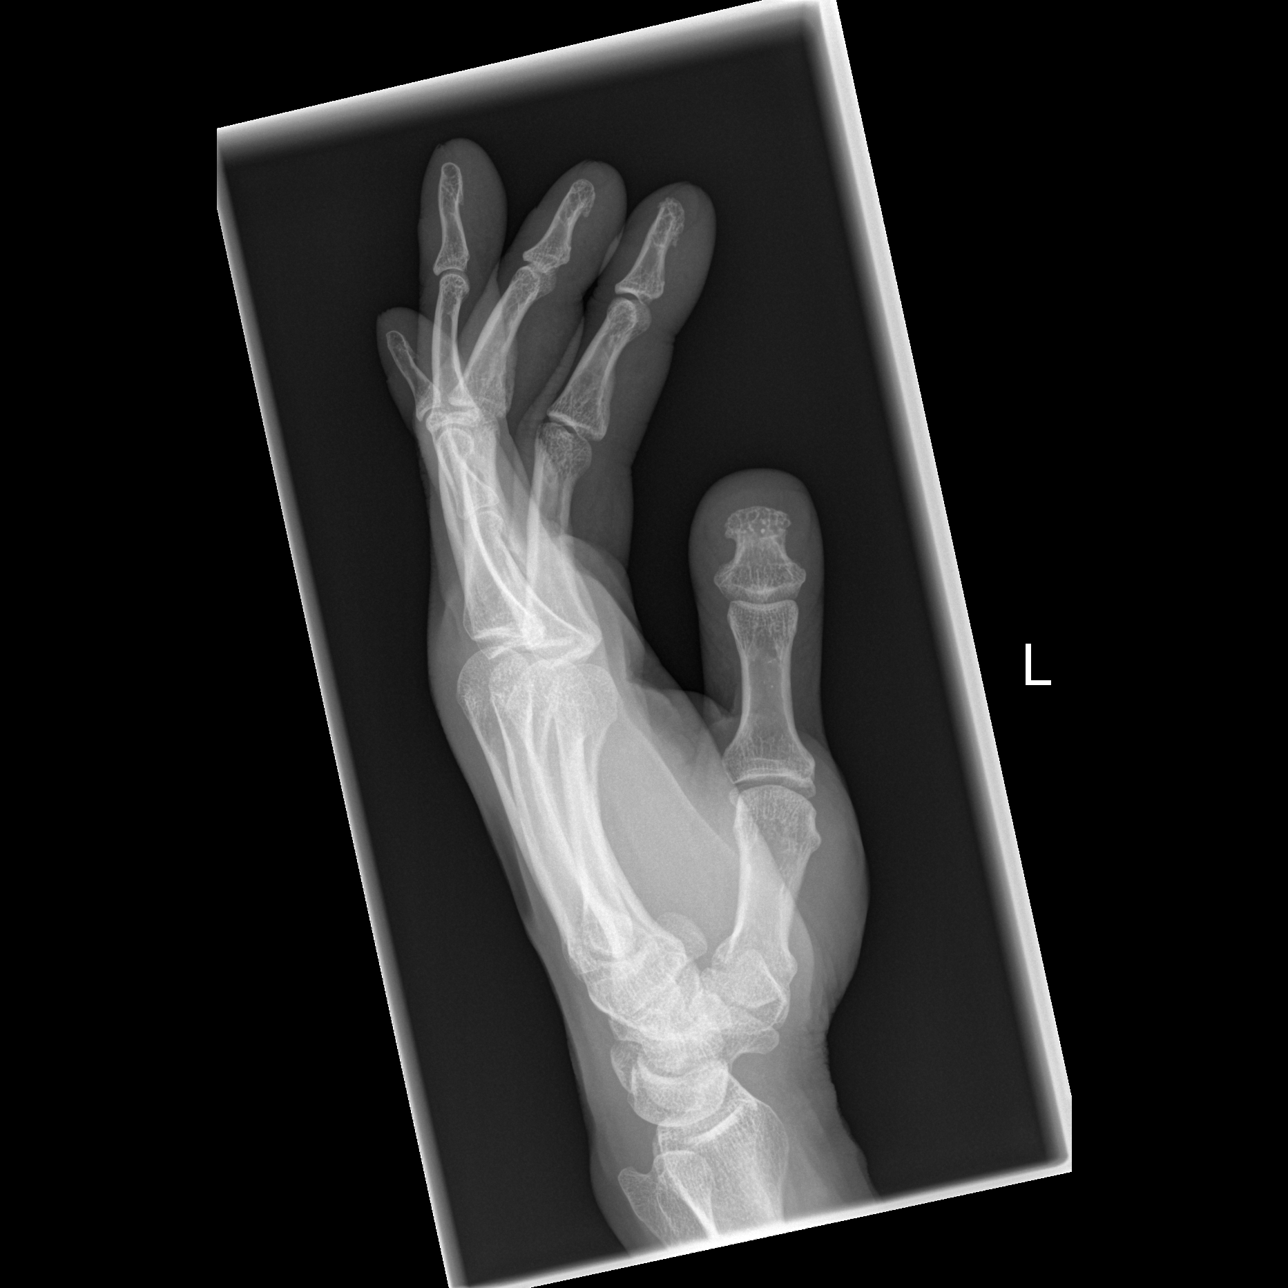

[3 of 3 positions shown; findings below may reference images not displayed]

FINDINGS: Oblique fracture of the proximal phalanx of the left third finger
with mild impaction of the fracture fragments. Slight volar
angulation of the distal fracture fragment. Fracture lines do not
definitely extend to the articular surface. No evidence of
dislocation. Mild soft tissue swelling. No additional fractures
identified.
IMPRESSION: Oblique fracture of the proximal phalanx of the left third finger
with mild impaction and angulation.

## 2018-04-11 ENCOUNTER — Other Ambulatory Visit: Payer: Self-pay

## 2018-04-11 ENCOUNTER — Encounter (HOSPITAL_BASED_OUTPATIENT_CLINIC_OR_DEPARTMENT_OTHER): Payer: Self-pay | Admitting: *Deleted

## 2018-04-11 ENCOUNTER — Emergency Department (HOSPITAL_BASED_OUTPATIENT_CLINIC_OR_DEPARTMENT_OTHER)
Admission: EM | Admit: 2018-04-11 | Discharge: 2018-04-12 | Disposition: A | Discharge status: Home / Self Care | Payer: No Typology Code available for payment source | Attending: Emergency Medicine | Admitting: Emergency Medicine

## 2018-04-11 ENCOUNTER — Emergency Department (HOSPITAL_BASED_OUTPATIENT_CLINIC_OR_DEPARTMENT_OTHER): Payer: No Typology Code available for payment source

## 2018-04-11 DIAGNOSIS — F17200 Nicotine dependence, unspecified, uncomplicated: Secondary | ICD-10-CM | POA: Insufficient documentation

## 2018-04-11 DIAGNOSIS — M25512 Pain in left shoulder: Secondary | ICD-10-CM | POA: Diagnosis present

## 2018-04-11 DIAGNOSIS — I10 Essential (primary) hypertension: Secondary | ICD-10-CM | POA: Insufficient documentation

## 2018-04-11 DIAGNOSIS — Z79899 Other long term (current) drug therapy: Secondary | ICD-10-CM | POA: Insufficient documentation

## 2018-04-11 DIAGNOSIS — M1990 Unspecified osteoarthritis, unspecified site: Secondary | ICD-10-CM

## 2018-04-11 DIAGNOSIS — Z7982 Long term (current) use of aspirin: Secondary | ICD-10-CM | POA: Insufficient documentation

## 2018-04-11 DIAGNOSIS — M13812 Other specified arthritis, left shoulder: Secondary | ICD-10-CM | POA: Diagnosis not present

## 2018-04-11 MED ORDER — ACETAMINOPHEN 500 MG PO TABS
1000.0000 mg | ORAL_TABLET | Freq: Once | ORAL | Status: AC
  Administered 2018-04-11: 1000 mg via ORAL
  Filled 2018-04-11: qty 2

## 2018-04-11 MED ORDER — NAPROXEN 250 MG PO TABS
500.0000 mg | ORAL_TABLET | Freq: Once | ORAL | Status: AC
  Administered 2018-04-11: 500 mg via ORAL
  Filled 2018-04-11: qty 2

## 2018-04-11 NOTE — ED Triage Notes (Signed)
Pt c/o left shoulder injury x 5 days ago at work

## 2018-04-11 NOTE — ED Notes (Signed)
Patient transported to X-ray 

## 2018-04-11 NOTE — ED Provider Notes (Signed)
[facility] EMERGENCY DEPARTMENT Provider Note   CSN: [id number] Arrival date & time: 04/11/18  2330     History   Chief Complaint Chief Complaint  Patient presents with  . Shoulder Injury    HPI Joshua Farley is a 47 y.o. male.  The history is provided by the patient.  Shoulder Injury  This is a new problem. The current episode started more than 2 days ago (5 days pushing a cart at work). The problem occurs constantly. The problem has not changed since onset.Pertinent negatives include no chest pain, no abdominal pain, no headaches and no shortness of breath. Nothing aggravates the symptoms. Nothing relieves the symptoms. He has tried nothing for the symptoms. The treatment provided no relief.    Past Medical History:  Diagnosis Date  . GERD (gastroesophageal reflux disease)   . Hypertension   . Reflux     There are no active problems to display for this patient.   Past Surgical History:  Procedure Laterality Date  . NO PAST SURGERIES    . OPEN REDUCTION INTERNAL FIXATION (ORIF) PROXIMAL PHALANX Left 11/12/2014   Procedure: OPEN REDUCTION INTERNAL FIXATION (ORIF) LEFT LONG PROXIMAL PHALANX FRACTURE ;  Surgeon: Dairl Ponder, MD;  Location: [facility];  Service: Orthopedics;  Laterality: Left;        Home Medications    Prior to Admission medications   Medication Sig Start Date End Date Taking? Authorizing Provider  aspirin 325 MG tablet Take 325 mg by mouth daily.    [provider]  Lansoprazole (PREVACID PO) Take by mouth.    [provider]  oxyCODONE-acetaminophen (PERCOCET) 5-325 MG per tablet Take 1 tablet by mouth every 6 (six) hours as needed. Patient not taking: Reported on 11/17/2014 11/09/14   Dalissa Lovin, MD  oxyCODONE-acetaminophen (ROXICET) 5-325 MG per tablet Take 1 tablet by mouth every 4 (four) hours as needed for severe pain. Patient not taking: Reported on 11/17/2014 11/12/14   Dairl Ponder, MD      Family History History reviewed. No pertinent family history.  Social History Social History   Tobacco Use  . Smoking status: Current Every Day Smoker    Packs/day: 0.50  . Smokeless tobacco: Never Used  Substance Use Topics  . Alcohol use: Yes    Comment: occasional   . Drug use: No     Allergies   Patient has no known allergies.   Review of Systems Review of Systems  Constitutional: Negative for fever.  Respiratory: Negative for shortness of breath.   Cardiovascular: Negative for chest pain.  Gastrointestinal: Negative for abdominal pain.  Musculoskeletal: Positive for arthralgias. Negative for gait problem, neck pain and neck stiffness.  Neurological: Negative for headaches.  All other systems reviewed and are negative.    Physical Exam Updated Vital Signs BP (!) 173/98 (BP Location: Right Arm)   Pulse 68   Temp 98.4 F (36.9 C)   Resp 16   Ht 5\' 7"  (1.702 m)   Wt 79.4 kg   SpO2 100%   BMI 27.41 kg/m   Physical Exam  Constitutional: He is oriented to person, place, and time. He appears well-developed and well-nourished. No distress.  HENT:  Head: Normocephalic and atraumatic.  Mouth/Throat: No oropharyngeal exudate.  Eyes: Conjunctivae and EOM are normal.  Neck: Normal range of motion. Neck supple.  Cardiovascular: Normal rate, regular rhythm, normal heart sounds and intact distal pulses.  Pulmonary/Chest: Effort normal and breath sounds normal. No stridor. He has no  wheezes. He has no rales.  Abdominal: Soft. He exhibits no mass. There is no tenderness. There is no rebound and no guarding.  Musculoskeletal: Normal range of motion.       Left shoulder: Normal.       Left elbow: Normal.       Left wrist: Normal.       Cervical back: Normal.       Left upper arm: Normal.       Left hand: Normal. He exhibits normal capillary refill. Normal sensation noted. Normal strength noted.  Neurological: He is alert and oriented to person, place, and time. He  displays normal reflexes.  Skin: Skin is warm and dry. Capillary refill takes less than 2 seconds.  Psychiatric: He has a normal mood and affect.  Nursing note and vitals reviewed.    ED Treatments / Results  Labs (all labs ordered are listed, but only abnormal results are displayed) Labs Reviewed - No data to display  EKG None  Radiology No results found.  Procedures Procedures (including critical care time)  Medications Ordered in ED Medications - No data to display     Final Clinical Impressions(s) / ED Diagnoses    Return for fevers >100.4 unrelieved by medication, shortness of breath, intractable vomiting, or diarrhea, Inability to tolerate liquids or food, cough, altered mental status or any concerns. No signs of systemic illness or infection. The patient is nontoxic-appearing on exam and vital signs are within normal limits.   I have reviewed the triage vital signs and the nursing notes. Pertinent labs &imaging results that were available during my care of the patient were reviewed by me and considered in my medical decision making (see chart for details).  After history, exam, and medical workup I feel the patient has been appropriately medically screened and is safe for discharge home. Pertinent diagnoses were discussed with the patient. Patient was given return precautions.     Arly Salminen, MD 04/11/18 [phone]

## 2018-04-12 MED ORDER — NAPROXEN 375 MG PO TABS: 375.0000 mg | ORAL_TABLET | Freq: Two times a day (BID) | ORAL | 0 refills | Status: DC

## 2019-12-28 ENCOUNTER — Emergency Department (HOSPITAL_COMMUNITY): Payer: Self-pay | Admitting: Anesthesiology

## 2019-12-28 ENCOUNTER — Emergency Department (HOSPITAL_BASED_OUTPATIENT_CLINIC_OR_DEPARTMENT_OTHER): Payer: Self-pay

## 2019-12-28 ENCOUNTER — Encounter (HOSPITAL_COMMUNITY)
Admission: EM | Disposition: A | Discharge status: Home / Self Care | Payer: Self-pay | Source: Home / Self Care | Attending: Emergency Medicine

## 2019-12-28 ENCOUNTER — Encounter (HOSPITAL_BASED_OUTPATIENT_CLINIC_OR_DEPARTMENT_OTHER): Payer: Self-pay | Admitting: Emergency Medicine

## 2019-12-28 ENCOUNTER — Other Ambulatory Visit: Payer: Self-pay

## 2019-12-28 ENCOUNTER — Emergency Department (HOSPITAL_BASED_OUTPATIENT_CLINIC_OR_DEPARTMENT_OTHER)
Admission: EM | Admit: 2019-12-28 | Discharge: 2019-12-28 | Disposition: A | Discharge status: Home / Self Care | Payer: Self-pay | Attending: Emergency Medicine | Admitting: Emergency Medicine

## 2019-12-28 DIAGNOSIS — F17210 Nicotine dependence, cigarettes, uncomplicated: Secondary | ICD-10-CM | POA: Insufficient documentation

## 2019-12-28 DIAGNOSIS — Y9389 Activity, other specified: Secondary | ICD-10-CM | POA: Insufficient documentation

## 2019-12-28 DIAGNOSIS — Y999 Unspecified external cause status: Secondary | ICD-10-CM | POA: Insufficient documentation

## 2019-12-28 DIAGNOSIS — Z23 Encounter for immunization: Secondary | ICD-10-CM | POA: Insufficient documentation

## 2019-12-28 DIAGNOSIS — S51811A Laceration without foreign body of right forearm, initial encounter: Secondary | ICD-10-CM

## 2019-12-28 DIAGNOSIS — Z20822 Contact with and (suspected) exposure to covid-19: Secondary | ICD-10-CM | POA: Insufficient documentation

## 2019-12-28 DIAGNOSIS — T148XXA Other injury of unspecified body region, initial encounter: Secondary | ICD-10-CM

## 2019-12-28 DIAGNOSIS — I10 Essential (primary) hypertension: Secondary | ICD-10-CM | POA: Insufficient documentation

## 2019-12-28 DIAGNOSIS — W25XXXA Contact with sharp glass, initial encounter: Secondary | ICD-10-CM | POA: Insufficient documentation

## 2019-12-28 DIAGNOSIS — Y92019 Unspecified place in single-family (private) house as the place of occurrence of the external cause: Secondary | ICD-10-CM | POA: Insufficient documentation

## 2019-12-28 DIAGNOSIS — S56921A Laceration of unspecified muscles, fascia and tendons at forearm level, right arm, initial encounter: Secondary | ICD-10-CM | POA: Insufficient documentation

## 2019-12-28 HISTORY — PX: LACERATION REPAIR: SHX5284

## 2019-12-28 LAB — CBC WITH DIFFERENTIAL/PLATELET
Abs Immature Granulocytes: 0.02 10*3/uL (ref 0.00–0.07)
Basophils Absolute: 0.1 10*3/uL (ref 0.0–0.1)
Basophils Relative: 1 %
Eosinophils Absolute: 0.2 10*3/uL (ref 0.0–0.5)
Eosinophils Relative: 3 %
HCT: 44.4 % (ref 39.0–52.0)
Hemoglobin: 15.3 g/dL (ref 13.0–17.0)
Immature Granulocytes: 0 %
Lymphocytes Relative: 35 %
Lymphs Abs: 2.4 10*3/uL (ref 0.7–4.0)
MCH: 31.2 pg (ref 26.0–34.0)
MCHC: 34.5 g/dL (ref 30.0–36.0)
MCV: 90.6 fL (ref 80.0–100.0)
Monocytes Absolute: 0.5 10*3/uL (ref 0.1–1.0)
Monocytes Relative: 7 %
Neutro Abs: 3.7 10*3/uL (ref 1.7–7.7)
Neutrophils Relative %: 54 %
Platelets: 386 10*3/uL (ref 150–400)
RBC: 4.9 MIL/uL (ref 4.22–5.81)
RDW: 14.5 % (ref 11.5–15.5)
WBC: 6.9 10*3/uL (ref 4.0–10.5)
nRBC: 0 % (ref 0.0–0.2)

## 2019-12-28 LAB — BASIC METABOLIC PANEL
Anion gap: 12 (ref 5–15)
BUN: 16 mg/dL (ref 6–20)
CO2: 22 mmol/L (ref 22–32)
Calcium: 8.7 mg/dL — ABNORMAL LOW (ref 8.9–10.3)
Chloride: 106 mmol/L (ref 98–111)
Creatinine, Ser: 1.3 mg/dL — ABNORMAL HIGH (ref 0.61–1.24)
GFR calc Af Amer: >60 mL/min (ref >60)
GFR calc non Af Amer: >60 mL/min (ref >60)
Glucose, Bld: 99 mg/dL (ref 70–99)
Potassium: 3.4 mmol/L — ABNORMAL LOW (ref 3.5–5.1)
Sodium: 140 mmol/L (ref 135–145)

## 2019-12-28 LAB — SARS CORONAVIRUS 2 BY RT PCR (HOSPITAL ORDER, PERFORMED IN ~~LOC~~ HOSPITAL LAB): SARS Coronavirus 2: NEGATIVE

## 2019-12-28 SURGERY — REPAIR, LACERATION, 2 OR MORE
Anesthesia: General | Laterality: Right

## 2019-12-28 MED ORDER — STERILE WATER FOR IRRIGATION IR SOLN
Status: DC | PRN
  Administered 2019-12-28: 1000 mL

## 2019-12-28 MED ORDER — PROPOFOL 10 MG/ML IV BOLUS
INTRAVENOUS | Status: DC | PRN
  Administered 2019-12-28: 200 mg via INTRAVENOUS

## 2019-12-28 MED ORDER — PROPOFOL 10 MG/ML IV BOLUS
INTRAVENOUS | Status: AC
  Filled 2019-12-28: qty 20

## 2019-12-28 MED ORDER — LIDOCAINE-EPINEPHRINE 1 %-1:100000 IJ SOLN
INTRAMUSCULAR | Status: AC
  Administered 2019-12-28: 20 mL via INTRADERMAL
  Filled 2019-12-28: qty 1

## 2019-12-28 MED ORDER — DEXAMETHASONE SODIUM PHOSPHATE 10 MG/ML IJ SOLN
INTRAMUSCULAR | Status: AC
  Filled 2019-12-28: qty 1

## 2019-12-28 MED ORDER — SUCCINYLCHOLINE CHLORIDE 200 MG/10ML IV SOSY
PREFILLED_SYRINGE | INTRAVENOUS | Status: DC | PRN
  Administered 2019-12-28: 120 mg via INTRAVENOUS

## 2019-12-28 MED ORDER — OXYCODONE HCL 5 MG/5ML PO SOLN: 5.0000 mg | Freq: Once | ORAL | Status: AC | PRN

## 2019-12-28 MED ORDER — MIDAZOLAM HCL 5 MG/5ML IJ SOLN
INTRAMUSCULAR | Status: DC | PRN
  Administered 2019-12-28: 2 mg via INTRAVENOUS

## 2019-12-28 MED ORDER — OXYCODONE HCL 5 MG PO TABS: 5.0000 mg | ORAL_TABLET | Freq: Four times a day (QID) | ORAL | 0 refills | Status: DC | PRN

## 2019-12-28 MED ORDER — FENTANYL CITRATE (PF) 250 MCG/5ML IJ SOLN
INTRAMUSCULAR | Status: AC
  Filled 2019-12-28: qty 5

## 2019-12-28 MED ORDER — LIDOCAINE-EPINEPHRINE 2 %-1:100000 IJ SOLN: 20.0000 mL | Freq: Once | INTRAMUSCULAR | Status: DC

## 2019-12-28 MED ORDER — FENTANYL CITRATE (PF) 100 MCG/2ML IJ SOLN
25.0000 ug | INTRAMUSCULAR | Status: DC | PRN
  Administered 2019-12-28 (×2): 50 ug via INTRAVENOUS

## 2019-12-28 MED ORDER — FENTANYL CITRATE (PF) 100 MCG/2ML IJ SOLN
100.0000 ug | Freq: Once | INTRAMUSCULAR | Status: AC
  Administered 2019-12-28: 100 ug via INTRAVENOUS
  Filled 2019-12-28 (×2): qty 2

## 2019-12-28 MED ORDER — CEFAZOLIN SODIUM-DEXTROSE 2-4 GM/100ML-% IV SOLN
INTRAVENOUS | Status: AC
  Filled 2019-12-28: qty 100

## 2019-12-28 MED ORDER — TETANUS-DIPHTH-ACELL PERTUSSIS 5-2.5-18.5 LF-MCG/0.5 IM SUSP
0.5000 mL | Freq: Once | INTRAMUSCULAR | Status: AC
  Administered 2019-12-28: 0.5 mL via INTRAMUSCULAR
  Filled 2019-12-28: qty 0.5

## 2019-12-28 MED ORDER — DEXAMETHASONE SODIUM PHOSPHATE 10 MG/ML IJ SOLN
INTRAMUSCULAR | Status: DC | PRN
  Administered 2019-12-28: 10 mg via INTRAVENOUS

## 2019-12-28 MED ORDER — CEFAZOLIN SODIUM-DEXTROSE 1-4 GM/50ML-% IV SOLN
1.0000 g | Freq: Once | INTRAVENOUS | Status: AC
  Administered 2019-12-28: 1 g via INTRAVENOUS
  Filled 2019-12-28: qty 50

## 2019-12-28 MED ORDER — LIDOCAINE-EPINEPHRINE 1 %-1:100000 IJ SOLN: 20.0000 mL | Freq: Once | INTRAMUSCULAR | Status: AC

## 2019-12-28 MED ORDER — MIDAZOLAM HCL 2 MG/2ML IJ SOLN
INTRAMUSCULAR | Status: AC
  Filled 2019-12-28: qty 2

## 2019-12-28 MED ORDER — 0.9 % SODIUM CHLORIDE (POUR BTL) OPTIME
TOPICAL | Status: DC | PRN
  Administered 2019-12-28: 1000 mL

## 2019-12-28 MED ORDER — IOHEXOL 350 MG/ML SOLN
100.0000 mL | Freq: Once | INTRAVENOUS | Status: AC | PRN
  Administered 2019-12-28: 100 mL via INTRAVENOUS

## 2019-12-28 MED ORDER — OXYCODONE HCL 5 MG PO TABS
5.0000 mg | ORAL_TABLET | Freq: Once | ORAL | Status: AC | PRN
  Administered 2019-12-28: 5 mg via ORAL

## 2019-12-28 MED ORDER — FENTANYL CITRATE (PF) 100 MCG/2ML IJ SOLN
INTRAMUSCULAR | Status: AC
  Filled 2019-12-28: qty 2

## 2019-12-28 MED ORDER — CHLORHEXIDINE GLUCONATE 0.12 % MT SOLN: 15.0000 mL | Freq: Once | OROMUCOSAL | Status: DC

## 2019-12-28 MED ORDER — LIDOCAINE 2% (20 MG/ML) 5 ML SYRINGE
INTRAMUSCULAR | Status: AC
  Filled 2019-12-28: qty 5

## 2019-12-28 MED ORDER — SUCCINYLCHOLINE CHLORIDE 200 MG/10ML IV SOSY
PREFILLED_SYRINGE | INTRAVENOUS | Status: AC
  Filled 2019-12-28: qty 10

## 2019-12-28 MED ORDER — EPHEDRINE SULFATE-NACL 50-0.9 MG/10ML-% IV SOSY
PREFILLED_SYRINGE | INTRAVENOUS | Status: DC | PRN
  Administered 2019-12-28 (×4): 5 mg via INTRAVENOUS

## 2019-12-28 MED ORDER — LACTATED RINGERS IV SOLN: INTRAVENOUS | Status: DC

## 2019-12-28 MED ORDER — EPHEDRINE 5 MG/ML INJ
INTRAVENOUS | Status: AC
  Filled 2019-12-28: qty 10

## 2019-12-28 MED ORDER — PHENYLEPHRINE 40 MCG/ML (10ML) SYRINGE FOR IV PUSH (FOR BLOOD PRESSURE SUPPORT)
PREFILLED_SYRINGE | INTRAVENOUS | Status: DC | PRN
  Administered 2019-12-28: 120 ug via INTRAVENOUS
  Administered 2019-12-28 (×3): 80 ug via INTRAVENOUS

## 2019-12-28 MED ORDER — ONDANSETRON HCL 4 MG/2ML IJ SOLN
INTRAMUSCULAR | Status: AC
  Filled 2019-12-28: qty 2

## 2019-12-28 MED ORDER — PROMETHAZINE HCL 25 MG/ML IJ SOLN: 6.2500 mg | INTRAMUSCULAR | Status: DC | PRN

## 2019-12-28 MED ORDER — AMOXICILLIN-POT CLAVULANATE 875-125 MG PO TABS: 1.0000 | ORAL_TABLET | Freq: Two times a day (BID) | ORAL | 0 refills | Status: AC

## 2019-12-28 MED ORDER — ONDANSETRON HCL 4 MG/2ML IJ SOLN
INTRAMUSCULAR | Status: DC | PRN
  Administered 2019-12-28: 4 mg via INTRAVENOUS

## 2019-12-28 MED ORDER — ROCURONIUM BROMIDE 10 MG/ML (PF) SYRINGE
PREFILLED_SYRINGE | INTRAVENOUS | Status: AC
  Filled 2019-12-28: qty 10

## 2019-12-28 MED ORDER — ONDANSETRON HCL 4 MG/2ML IJ SOLN
4.0000 mg | Freq: Once | INTRAMUSCULAR | Status: AC
Start: 2019-12-28 — End: 2019-12-28
  Administered 2019-12-28: 4 mg via INTRAVENOUS
  Filled 2019-12-28: qty 2

## 2019-12-28 MED ORDER — LIDOCAINE 2% (20 MG/ML) 5 ML SYRINGE
INTRAMUSCULAR | Status: DC | PRN
  Administered 2019-12-28: 80 mg via INTRAVENOUS

## 2019-12-28 MED ORDER — CHLORHEXIDINE GLUCONATE 0.12 % MT SOLN
OROMUCOSAL | Status: AC
  Filled 2019-12-28: qty 15

## 2019-12-28 MED ORDER — IBUPROFEN 200 MG PO TABS
600.0000 mg | ORAL_TABLET | Freq: Four times a day (QID) | ORAL | Status: DC
Start: 2019-12-28 — End: 2020-11-27

## 2019-12-28 MED ORDER — FENTANYL CITRATE (PF) 100 MCG/2ML IJ SOLN
100.0000 ug | Freq: Once | INTRAMUSCULAR | Status: AC
  Administered 2019-12-28: 100 ug via INTRAVENOUS

## 2019-12-28 MED ORDER — PHENYLEPHRINE 40 MCG/ML (10ML) SYRINGE FOR IV PUSH (FOR BLOOD PRESSURE SUPPORT)
PREFILLED_SYRINGE | INTRAVENOUS | Status: AC
  Filled 2019-12-28: qty 10

## 2019-12-28 MED ORDER — FENTANYL CITRATE (PF) 100 MCG/2ML IJ SOLN
INTRAMUSCULAR | Status: AC
  Administered 2019-12-28: 50 ug via INTRAVENOUS
  Filled 2019-12-28: qty 2

## 2019-12-28 MED ORDER — FENTANYL CITRATE (PF) 100 MCG/2ML IJ SOLN: 50.0000 ug | Freq: Once | INTRAMUSCULAR | Status: AC

## 2019-12-28 MED ORDER — ACETAMINOPHEN 325 MG PO TABS
650.0000 mg | ORAL_TABLET | Freq: Four times a day (QID) | ORAL | Status: DC
Start: 2019-12-28 — End: 2020-11-27

## 2019-12-28 MED ORDER — CEFAZOLIN SODIUM-DEXTROSE 2-4 GM/100ML-% IV SOLN
2.0000 g | Freq: Once | INTRAVENOUS | Status: AC
  Administered 2019-12-28: 2 g via INTRAVENOUS

## 2019-12-28 MED ORDER — OXYCODONE HCL 5 MG PO TABS
ORAL_TABLET | ORAL | Status: AC
  Filled 2019-12-28: qty 1

## 2019-12-28 MED ORDER — FENTANYL CITRATE (PF) 100 MCG/2ML IJ SOLN
INTRAMUSCULAR | Status: DC | PRN
  Administered 2019-12-28 (×3): 50 ug via INTRAVENOUS

## 2019-12-28 SURGICAL SUPPLY — 49 items
BAND RUBBER #18 3X1/16 STRL (MISCELLANEOUS) IMPLANT
BLADE SURG 15 STRL LF DISP TIS (BLADE) ×2 IMPLANT
BLADE SURG 15 STRL SS (BLADE) ×4
BNDG COHESIVE 2X5 TAN STRL LF (GAUZE/BANDAGES/DRESSINGS) IMPLANT
BNDG COHESIVE 4X5 TAN STRL (GAUZE/BANDAGES/DRESSINGS) ×3 IMPLANT
BNDG ESMARK 4X9 LF (GAUZE/BANDAGES/DRESSINGS) ×3 IMPLANT
BNDG GAUZE ELAST 4 BULKY (GAUZE/BANDAGES/DRESSINGS) ×3 IMPLANT
BRUSH SCRUB EZ PLAIN DRY (MISCELLANEOUS) IMPLANT
CANISTER SUCT 3000ML PPV (MISCELLANEOUS) ×3 IMPLANT
CHLORAPREP W/TINT 26 (MISCELLANEOUS) ×3 IMPLANT
CORD BIPOLAR FORCEPS 12FT (ELECTRODE) ×3 IMPLANT
COVER WAND RF STERILE (DRAPES) IMPLANT
CUFF TOURN SGL QUICK 18X4 (TOURNIQUET CUFF) ×3 IMPLANT
CUFF TOURN SGL QUICK 24 (TOURNIQUET CUFF)
CUFF TRNQT CYL 24X4X16.5-23 (TOURNIQUET CUFF) IMPLANT
DRAPE SURG 17X23 STRL (DRAPES) ×6 IMPLANT
DRSG ADAPTIC 3X8 NADH LF (GAUZE/BANDAGES/DRESSINGS) IMPLANT
DRSG EMULSION OIL 3X3 NADH (GAUZE/BANDAGES/DRESSINGS) ×3 IMPLANT
GAUZE SPONGE 4X4 12PLY STRL (GAUZE/BANDAGES/DRESSINGS) ×3 IMPLANT
GLOVE BIO SURGEON STRL SZ7.5 (GLOVE) ×6 IMPLANT
GLOVE BIOGEL PI IND STRL 7.0 (GLOVE) ×1 IMPLANT
GLOVE BIOGEL PI IND STRL 8 (GLOVE) ×2 IMPLANT
GLOVE BIOGEL PI INDICATOR 7.0 (GLOVE) ×2
GLOVE BIOGEL PI INDICATOR 8 (GLOVE) ×4
GLOVE ECLIPSE 6.5 STRL STRAW (GLOVE) ×3 IMPLANT
GOWN STRL REUS W/ TWL LRG LVL3 (GOWN DISPOSABLE) ×1 IMPLANT
GOWN STRL REUS W/ TWL XL LVL3 (GOWN DISPOSABLE) ×1 IMPLANT
GOWN STRL REUS W/TWL LRG LVL3 (GOWN DISPOSABLE) ×2
GOWN STRL REUS W/TWL XL LVL3 (GOWN DISPOSABLE) ×2
KIT BASIN OR (CUSTOM PROCEDURE TRAY) ×3 IMPLANT
NEEDLE HYPO 25X1 1.5 SAFETY (NEEDLE) IMPLANT
NS IRRIG 1000ML POUR BTL (IV SOLUTION) ×3 IMPLANT
PACK ORTHO EXTREMITY (CUSTOM PROCEDURE TRAY) ×3 IMPLANT
PAD CAST 4YDX4 CTTN HI CHSV (CAST SUPPLIES) ×1 IMPLANT
PADDING CAST ABS 4INX4YD NS (CAST SUPPLIES) ×2
PADDING CAST ABS COTTON 4X4 ST (CAST SUPPLIES) ×1 IMPLANT
PADDING CAST COTTON 4X4 STRL (CAST SUPPLIES) ×2
SET IRRIG Y TYPE TUR BLADDER L (SET/KITS/TRAYS/PACK) IMPLANT
SPLINT FIBERGLASS 3X12 (CAST SUPPLIES) ×3 IMPLANT
STOCKINETTE 6  STRL (DRAPES) ×2
STOCKINETTE 6 STRL (DRAPES) ×1 IMPLANT
SUT VIC AB 2-0 CT3 27 (SUTURE) IMPLANT
SUT VIC AB 2-0 SH 27 (SUTURE) ×2
SUT VIC AB 2-0 SH 27X BRD (SUTURE) ×1 IMPLANT
SUT VICRYL 4-0 PS2 18IN ABS (SUTURE) IMPLANT
SUT VICRYL RAPIDE 4/0 PS 2 (SUTURE) ×3 IMPLANT
SYR 10ML LL (SYRINGE) IMPLANT
TOWEL GREEN STERILE FF (TOWEL DISPOSABLE) ×3 IMPLANT
UNDERPAD 30X36 HEAVY ABSORB (UNDERPADS AND DIAPERS) ×3 IMPLANT

## 2019-12-28 NOTE — Op Note (Signed)
12/28/2019  8:46 AM  PATIENT:  Joshua Farley  49 y.o. male  PRE-OPERATIVE DIAGNOSIS: Right dorsal forearm deep laceration  POST-OPERATIVE DIAGNOSIS:  Same, with confirmed muscle injury  PROCEDURE:   Removal of sutures from right dorsal forearm laceration     Exploration of right dorsal forearm laceration with excisional debridement of skin, subcutaneous tissue and muscle    Repair of right EDC muscle    Repair of right ECU muscle/tendon    Intermediate closure of right dorsal forearm laceration, 5 cm  SURGEON: Cliffton Asters. Janee Morn, MD  PHYSICIAN ASSISTANT: None  ANESTHESIA:  general  SPECIMENS:  None  DRAINS:   None  EBL:  less than 50 mL  PREOPERATIVE INDICATIONS:  Joshua Farley is a  49 y.o. male with a deep laceration of the right dorsal forearm, slightly proximal to mid forearm, with concerns for contained hematoma and muscle injury  The risks benefits and alternatives were discussed with the patient preoperatively including but not limited to the risks of infection, bleeding, nerve injury, cardiopulmonary complications, the need for revision surgery, among others, and the patient verbalized understanding and consented to proceed.  OPERATIVE IMPLANTS: None  OPERATIVE PROCEDURE:  After receiving prophylactic antibiotics, the patient was escorted to the operative theatre and placed in a supine position.  General anesthesia was administered.  A surgical "time-out" was performed during which the planned procedure, proposed operative site, and the correct patient identity were compared to the operative consent and agreement confirmed by the circulating nurse according to current facility policy.  Following application of a tourniquet to the operative extremity, the exposed skin was prepped with Chloraprep and draped in the usual sterile fashion.  The sutures were removed from the major laceration and clot evacuated.  There was some fresh bleeding encountered, so the limb was  exsanguinated with an Esmarch bandage and the tourniquet inflated to approximately higher than systolic BP.  A couple of sutures that have been placed to close a smaller adjacent laceration were not removed.  In order to facilitate exploration, the larger mostly transverse wound was extended proximally and distally to allow for flap retraction, which was done with a retention suture.  The wound was copiously irrigated and the damage surveyed.  The ECU and large portions of the EDC were found to be transected, with the EDC being primarily muscle belly, and there still being a little bit of tendon to work within the [employer].  The ECU tendinous portion was repaired with a modified Kessler of 2-0 Ethibond, and the ECU with a Mason-Allen type stitch, also of Ethibond.  This was done with the wrist extended and the digits extended, bringing the muscle injury into closer approximation.  Once this was done, 2-0 Vicryl was used to augment the repair and bring a little more of the muscle into direct apposition.  2-0 Vicryl suture was used to reapproximate the epimysium and the forearm fascia loosely.  The tourniquet was released and the wound irrigated.  There appeared to be no tendency toward reaccumulation of hematoma or any hemorrhage to that extent.  Attention was shifted to wound closure, with 2-0 Vicryl deep dermal buried subcuticular sutures were first placed, followed by running 4-0 Vicryl Rapide horizontal mattress suture, closing both the surgically created incision as well as the traumatic wound (traumatic wound = 5cm).  A short arm splint dressing was applied, with volar fiberglass, placing the wrist into extension and the MP joints to neutral.  He was awakened and taken to the  recovery room in stable condition, breathing spontaneously.  DISPOSITION: He will be discharged home with typical postop instructions to include an analgesic plan and antibiotics, and the office will call him on Tuesday to make a  follow-up appointment, likely for a week of 01-06-20.

## 2019-12-28 NOTE — ED Provider Notes (Signed)
[facility] Provider Note: Joshua Dell, MD, FACEP  CSN: [id number] MRN: [id number] ARRIVAL: 12/28/19 at 0137 ROOM: MH09/MH09   CHIEF COMPLAINT  Extremity Laceration   HISTORY OF PRESENT ILLNESS  12/28/19 4:17 AM Joshua Farley is a 49 y.o. male who cut his right dorsal forearm on glass trying to break a window to get into his house about 1 hour prior to arrival.  He has decreased ability to extend the fingers of that hand.  There is no numbness of those fingers.  He rates associated pain is a 10 out of 10 at the site of the laceration, throbbing in nature, worse with palpation or movement of his hand or wrist.  There is swelling around the laceration which has been increasing in size.  There was brisk bleeding earlier which has improved.   Past Medical History:  Diagnosis Date  . GERD (gastroesophageal reflux disease)   . Hypertension   . Reflux     Past Surgical History:  Procedure Laterality Date  . NO PAST SURGERIES    . OPEN REDUCTION INTERNAL FIXATION (ORIF) PROXIMAL PHALANX Left 11/12/2014   Procedure: OPEN REDUCTION INTERNAL FIXATION (ORIF) LEFT LONG PROXIMAL PHALANX FRACTURE ;  Surgeon: Dairl Ponder, MD;  Location: [facility];  Service: Orthopedics;  Laterality: Left;    History reviewed. No pertinent family history.  Social History   Tobacco Use  . Smoking status: Current Every Day Smoker    Packs/day: 0.50  . Smokeless tobacco: Never Used  Substance Use Topics  . Alcohol use: Yes    Comment: occasional   . Drug use: No    Prior to Admission medications   Medication Sig Start Date End Date Taking? Authorizing Provider  Lansoprazole (PREVACID PO) Take by mouth.  12/28/19  [provider]    Allergies Lisinopril   REVIEW OF SYSTEMS  Negative except as noted here or in the History of Present Illness.   PHYSICAL EXAMINATION  Initial Vital Signs Blood pressure (!) 161/113, pulse 94, temperature 98.4 F (36.9  C), temperature source Oral, resp. rate 20, height 5\' 7"  (1.702 m), weight 79.4 kg, SpO2 98 %.  Examination General: Well-developed, well-nourished male in no acute distress; appearance consistent with age of record HENT: normocephalic; atraumatic Eyes: Normal appearance Neck: supple Heart: regular rate and rhythm Lungs: clear to auscultation bilaterally Abdomen: soft; nondistended; nontender; bowel sounds present Extremities: Large laceration right dorsal forearm with hematoma and wound, limited ability to extend fingers distally, notably the second through fourth fingers, sensation intact distally, second shallower laceration seen adjacent to large laceration:     Neurologic: Awake, alert and oriented; motor function intact in all extremities and symmetric; no facial droop Skin: Warm and dry Psychiatric: Normal mood and affect   RESULTS  Summary of this visit's results, reviewed and interpreted by myself:   EKG Interpretation  Date/Time:    Ventricular Rate:    PR Interval:    QRS Duration:   QT Interval:    QTC Calculation:   R Axis:     Text Interpretation:        Laboratory Studies: Results for orders placed or performed during the hospital encounter of 12/28/19 (from the past 24 hour(s))  CBC with Differential/Platelet     Status: None   Collection Time: 12/28/19  2:25 AM  Result Value Ref Range   WBC 6.9 4.0 - 10.5 K/uL   RBC 4.90 4.22 - 5.81 MIL/uL   Hemoglobin 15.3 13.0 - 17.0 g/dL  HCT 44.4 39.0 - 52.0 %   MCV 90.6 80.0 - 100.0 fL   MCH 31.2 26.0 - 34.0 pg   MCHC 34.5 30.0 - 36.0 g/dL   RDW 14.5 11.5 - 15.5 %   Platelets 386 150 - 400 K/uL   nRBC 0.0 0.0 - 0.2 %   Neutrophils Relative % 54 %   Neutro Abs 3.7 1.7 - 7.7 K/uL   Lymphocytes Relative 35 %   Lymphs Abs 2.4 0.7 - 4.0 K/uL   Monocytes Relative 7 %   Monocytes Absolute 0.5 0.1 - 1.0 K/uL   Eosinophils Relative 3 %   Eosinophils Absolute 0.2 0.0 - 0.5 K/uL   Basophils Relative 1 %    Basophils Absolute 0.1 0.0 - 0.1 K/uL   Immature Granulocytes 0 %   Abs Immature Granulocytes 0.02 0.00 - 0.07 K/uL  Basic metabolic panel     Status: Abnormal   Collection Time: 12/28/19  2:25 AM  Result Value Ref Range   Sodium 140 135 - 145 mmol/L   Potassium 3.4 (L) 3.5 - 5.1 mmol/L   Chloride 106 98 - 111 mmol/L   CO2 22 22 - 32 mmol/L   Glucose, Bld 99 70 - 99 mg/dL   BUN 16 6 - 20 mg/dL   Creatinine, Ser 1.30 (H) 0.61 - 1.24 mg/dL   Calcium 8.7 (L) 8.9 - 10.3 mg/dL   GFR calc non Af Amer >60 >60 mL/min   GFR calc Af Amer >60 >60 mL/min   Anion gap 12 5 - 15   Imaging Studies: DG Forearm Right  Result Date: 12/28/2019 CLINICAL DATA:  Laceration to forearm from glass. EXAM: RIGHT FOREARM - 2 VIEW COMPARISON:  Report from remote elbow radiograph 12/27/2002 FINDINGS: Remote radial head fracture. No evidence of acute fracture. Wrist alignment is maintained. Soft tissue edema in the mid forearm with overlying dressing in place, likely site of laceration. No radiopaque foreign body. IMPRESSION: Soft tissue edema in the mid forearm with overlying dressing in place, likely site of laceration. No radiopaque foreign body or acute osseous abnormality. Electronically Signed   By: Keith Rake M.D.   On: 12/28/2019 02:24   CT ANGIO UP EXTREM RIGHT W &/OR WO CONTRAST  Result Date: 12/28/2019 CLINICAL DATA:  Right forearm laceration with expanding hematoma. Extensor deficit. EXAM: CT ANGIOGRAPHY OF THE RIGHT UPPER EXTREMITY TECHNIQUE: Multidetector CT imaging of the right upper extremity was performed using the standard protocol during bolus administration of intravenous contrast. Multiplanar CT image reconstructions and MIPs were obtained to evaluate the vascular anatomy. CONTRAST:  14mL OMNIPAQUE IOHEXOL 350 MG/ML SOLN COMPARISON:  Radiograph earlier today. FINDINGS: The included right subclavian and axillary arteries are widely patent. Circumflex artery is widely patent. Brachial artery is  widely patent. The radial and ulnar arteries are widely patent. No evidence of arterial injury. Palmar arteries are well opacified. There is a laceration involving the extensor compartment of the forearm with soft tissue and intramuscular air. In the region of laceration there is active extravasation within an intramuscular branch in the extensor compartment of the forearm, series 4, image 156. Few bubbles of gas track into the radial aspect of the forearm. There is associated skin irregularity. No acute fracture. No overlying dressing is in place. Atherosclerosis of the right superficial femoral artery is partially included in the field of view. Review of the MIP images confirms the above findings. IMPRESSION: 1. Small focus of active arterial extravasation from an intramuscular branch in the extensor compartment  of the forearm in the region of laceration. No evidence of injury to the major arterial structures of the right upper extremity. 2. Laceration involving the extensor compartment with small foci of intramuscular gas and skin injury. These results were called by telephone at the time of interpretation on 12/28/2019 at 3:43 am to Dr Paula Libra , who verbally acknowledged these results. Electronically Signed   By: Narda Rutherford M.D.   On: 12/28/2019 03:43    ED COURSE and MDM  Nursing notes, initial and subsequent vitals signs, including pulse oximetry, reviewed and interpreted by myself.  Vitals:   12/28/19 0146 12/28/19 0150 12/28/19 0507  BP:  (!) 161/113 (!) 144/95  Pulse:  94 70  Resp:  20 18  Temp:  98.4 F (36.9 C)   TempSrc:  Oral   SpO2:  98% 95%  Weight: 79.4 kg    Height: 5\' 7"  (1.702 m)     Medications  Tdap (BOOSTRIX) injection 0.5 mL (0.5 mLs Intramuscular Given 12/28/19 0236)  ceFAZolin (ANCEF) IVPB 1 g/50 mL premix (0 g Intravenous Stopped 12/28/19 0311)  ondansetron (ZOFRAN) injection 4 mg (4 mg Intravenous Given 12/28/19 0232)  fentaNYL (SUBLIMAZE) injection 100 mcg (100  mcg Intravenous Given 12/28/19 0259)  iohexol (OMNIPAQUE) 350 MG/ML injection 100 mL (100 mLs Intravenous Contrast Given 12/28/19 0311)  fentaNYL (SUBLIMAZE) injection 100 mcg (100 mcg Intravenous Given 12/28/19 0419)  lidocaine-EPINEPHrine (XYLOCAINE W/EPI) 1 %-1:100000 (with pres) injection 20 mL (20 mLs Intradermal Given by Other 12/28/19 0420)   4:56 AM Hematoma evacuated from wound on right forearm but as soon as tourniquet was released following wound closure the hematoma acutely reaccumulated.  This puts him at risk of a compartment syndrome.  We will contact hand surgery for definitive exploration and treatment of muscular and vascular injury.  5:19 AM Discussed with Dr. 12/30/19 of hand surgery who would like the patient transferred to [facility] for definitive treatment.  [facility]. [facility], EDP, advised to patient transfer.  [facility] en route.  Sensation in patient's fingers still intact with brisk capillary refill.  Pressure dressing placed over hematoma.   PROCEDURES  Procedures  LACERATION REPAIR Performed by: Bebe Shaggy Rylend Pietrzak Authorized by: Carlisle Beers Kevin Mario Consent: Verbal consent obtained. Risks and benefits: risks, benefits and alternatives were discussed Consent given by: patient Patient identity confirmed: provided demographic data Prepped and Draped in normal sterile fashion Pressure tourniquet applied to right upper arm and wound explored, hematoma then manually evacuated  Laceration Location: Right forearm  Laceration Length: 4.5 cm  Anesthesia: local infiltration  Local anesthetic: lidocaine 1% with epinephrine  Anesthetic total: 8 ml  Irrigation method: syringe Amount of cleaning: standard  Skin closure: 4-0 Prolene  Number of sutures: 7  Technique: Simple interrupted  Patient tolerance: Patient tolerated the procedure well with no immediate complications.  LACERATION REPAIR Performed by: Carlisle Beers Elliott Quade Authorized by: Carlisle Beers Yessica Putnam Consent: Verbal consent  obtained. Risks and benefits: risks, benefits and alternatives were discussed Consent given by: patient Patient identity confirmed: provided demographic data Prepped and Draped in normal sterile fashion Wound explored  Laceration Location: Right forearm  Laceration Length: 2.2 cm  No Foreign Bodies seen or palpated  Anesthesia: local infiltration  Local anesthetic: lidocaine 1% with epinephrine  Anesthetic total: 2 ml  Irrigation method: syringe Amount of cleaning: standard  Skin closure: 4-0 Prolene  Number of sutures: 3  Technique: Simple interrupted  Patient tolerance: Patient tolerated the procedure well with no immediate complications.  CRITICAL CARE Performed by: Carlisle Beers Kassandra Meriweather Total critical care time: 35 minutes Critical care time was exclusive of separately billable procedures and treating other patients. Critical care was necessary to treat or prevent imminent or life-threatening deterioration. Critical care was time spent personally by me on the following activities: development of treatment plan with patient and/or surrogate as well as nursing, discussions with consultants, evaluation of patient's response to treatment, examination of patient, obtaining history from patient or surrogate, ordering and performing treatments and interventions, ordering and review of laboratory studies, ordering and review of radiographic studies, pulse oximetry and re-evaluation of patient's condition.   ED DIAGNOSES     ICD-10-CM   1. Laceration of right forearm, initial encounter  S51.811A   2. Laceration of muscle of right forearm  S56.921A   3. Intramuscular hematoma  T14.8XXA        Murlean Seelye, Jonny Ruiz, MD 12/28/19 [phone]

## 2019-12-28 NOTE — Transfer of Care (Signed)
Immediate Anesthesia Transfer of Care Note  Patient: [facility]  Procedure(s) Performed: REPAIR FOREARM LACERATION (Right )  Patient Location: PACU  Anesthesia Type:General  Level of Consciousness: drowsy  Airway & Oxygen Therapy: Patient Spontanous Breathing and Patient connected to face mask oxygen  Post-op Assessment: Report given to RN and Post -op Vital signs reviewed and stable  Post vital signs: Reviewed and stable  Last Vitals:  Vitals Value Taken Time  BP    Temp    Pulse    Resp    SpO2      Last Pain:  Vitals:   12/28/19 1020  TempSrc:   PainSc: (P) Asleep         Complications: No apparent anesthesia complications

## 2019-12-28 NOTE — Anesthesia Procedure Notes (Signed)
Procedure Name: Intubation Date/Time: 12/28/2019 9:22 AM Performed by: Candis Shine, CRNA Pre-anesthesia Checklist: Patient identified, Emergency Drugs available, Suction available and Patient being monitored Patient Re-evaluated:Patient Re-evaluated prior to induction Oxygen Delivery Method: Circle System Utilized Preoxygenation: Pre-oxygenation with 100% oxygen Induction Type: IV induction, Rapid sequence and Cricoid Pressure applied Laryngoscope Size: Mac and 4 Grade View: Grade II Tube type: Oral Tube size: 8.0 mm Number of attempts: 1 Airway Equipment and Method: Stylet Placement Confirmation: ETT inserted through vocal cords under direct vision,  positive ETCO2 and breath sounds checked- equal and bilateral Secured at: 23 cm Tube secured with: Tape Dental Injury: Teeth and Oropharynx as per pre-operative assessment

## 2019-12-28 NOTE — ED Notes (Signed)
Patient from [facility] ED, punched his hand thru a window.  He can move thumb, pinky and index fingers at this time.  No movement with middle or ring finger.  Patient is CAOx4.  Patient to go to OR with Dr Janee Morn.

## 2019-12-28 NOTE — Anesthesia Preprocedure Evaluation (Addendum)
Anesthesia Evaluation  Patient identified by MRN, date of birth, ID band Patient awake    Reviewed: Allergy & Precautions, NPO status , Patient's Chart, lab work & pertinent test results  History of Anesthesia Complications Negative for: history of anesthetic complications  Airway Mallampati: II  TM Distance: >3 FB Neck ROM: Full    Dental  (+) Dental Advisory Given, Teeth Intact   Pulmonary Current Smoker and Patient abstained from smoking.,    Pulmonary exam normal        Cardiovascular hypertension (no meds currently), Normal cardiovascular exam     Neuro/Psych negative neurological ROS  negative psych ROS   GI/Hepatic Neg liver ROS, GERD  Controlled,  Endo/Other  negative endocrine ROS  Renal/GU Renal InsufficiencyRenal disease     Musculoskeletal negative musculoskeletal ROS (+)   Abdominal   Peds  Hematology negative hematology ROS (+)   Anesthesia Other Findings Covid neg 5/29   Reproductive/Obstetrics                            Anesthesia Physical Anesthesia Plan  ASA: II  Anesthesia Plan: General   Post-op Pain Management:    Induction: Intravenous  PONV Risk Score and Plan: 2 and Treatment may vary due to age or medical condition, Ondansetron, Dexamethasone and Midazolam  Airway Management Planned: Oral ETT  Additional Equipment: None  Intra-op Plan:   Post-operative Plan: Extubation in OR  Informed Consent: I have reviewed the patients History and Physical, chart, labs and discussed the procedure including the risks, benefits and alternatives for the proposed anesthesia with the patient or authorized representative who has indicated his/her understanding and acceptance.     Dental advisory given  Plan Discussed with: CRNA and Anesthesiologist  Anesthesia Plan Comments:        Anesthesia Quick Evaluation

## 2019-12-28 NOTE — Anesthesia Postprocedure Evaluation (Signed)
Anesthesia Post Note  Patient: [facility]  Procedure(s) Performed: REPAIR FOREARM LACERATION (Right )     Patient location during evaluation: PACU Anesthesia Type: General Level of consciousness: awake and alert Pain management: pain level controlled Vital Signs Assessment: post-procedure vital signs reviewed and stable Respiratory status: spontaneous breathing, nonlabored ventilation and respiratory function stable Cardiovascular status: blood pressure returned to baseline and stable Postop Assessment: no apparent nausea or vomiting Anesthetic complications: no    Last Vitals:  Vitals:   12/28/19 1035 12/28/19 1050  BP: (!) 165/110 (!) 142/89  Pulse: 74 77  Resp: 16 12  Temp:  36.8 C  SpO2: 96% 98%                  Beryle Lathe

## 2019-12-28 NOTE — Discharge Instructions (Signed)
Keep your bandages on, keep them clean and dry.  There is a splint within the bandages which is important for the repair of the structures.  If you remove the splint, it could cause the repair to fail. Take the pain medicines as prescribed, using ibuprofen and Tylenol around-the-clock, supplementing occasionally with oxycodone as may be needed. The office will call you to make a follow-up appointment.

## 2019-12-28 NOTE — ED Notes (Signed)
Pt states wanting to wait for pain medication, will return fentanyl to pyx until requested by pt.

## 2019-12-28 NOTE — ED Notes (Signed)
Pt c/o numbness and tingling to 3 proximal digits on r side, good cap refill/warmth to digits noted.

## 2019-12-28 NOTE — ED Notes (Signed)
Pt transported to CT ?

## 2019-12-28 NOTE — ED Triage Notes (Signed)
Patient presents with laceration to right forearm; states cut on glass trying to break a window to get into his house. States occurred approx 1 hour pta. States decreased ROM to thumb, and 2-5th digits.

## 2019-12-28 NOTE — ED Notes (Signed)
ED Provider at bedside. 

## 2019-12-28 NOTE — Consult Note (Signed)
ORTHOPAEDIC CONSULTATION HISTORY & PHYSICAL REQUESTING PHYSICIAN: Molpus, John, MD  Chief Complaint: Right dorsal forearm laceration with expanding hematoma  HPI: Joshua Farley is a 49 y.o. male who reportedly lacerated the dorsal surface of his right forearm attempting to gain access to his house, breaking the window.  He reports it occurred about an hour prior to arrival in the ED, which itself was about 1:30 AM.  He reported no numbness.  There was apparently fairly brisk bleeding from the major of the 2 wounds, already with a hematoma.  Under tourniquet control, the emergency physician irrigated and closed the wound, but noted reexpansion of the hematoma following its evacuation and closure.  He obtained a CT angiogram which noted all major arteries to be intact, but with some extravasation of arterial flow within the muscle itself.  Past Medical History:  Diagnosis Date  . GERD (gastroesophageal reflux disease)   . Hypertension   . Reflux    Past Surgical History:  Procedure Laterality Date  . NO PAST SURGERIES    . OPEN REDUCTION INTERNAL FIXATION (ORIF) PROXIMAL PHALANX Left 11/12/2014   Procedure: OPEN REDUCTION INTERNAL FIXATION (ORIF) LEFT LONG PROXIMAL PHALANX FRACTURE ;  Surgeon: Dairl Ponder, MD;  Location:  [facility];  Service: Orthopedics;  Laterality: Left;   Social History   Socioeconomic History  . Marital status: Single    Spouse name: Not on file  . Number of children: Not on file  . Years of education: Not on file  . Highest education level: Not on file  Occupational History  . Not on file  Tobacco Use  . Smoking status: Current Every Day Smoker    Packs/day: 0.50  . Smokeless tobacco: Never Used  Substance and Sexual Activity  . Alcohol use: Yes    Comment: occasional   . Drug use: No  . Sexual activity: Not on file  Other Topics Concern  . Not on file  Social History Narrative  . Not on file   Social Determinants of Health    Financial Resource Strain:   . Difficulty of Paying Living Expenses:   Food Insecurity:   . Worried About Programme researcher, broadcasting/film/video in the Last Year:   . Barista in the Last Year:   Transportation Needs:   . Freight forwarder (Medical):   Marland Kitchen Lack of Transportation (Non-Medical):   Physical Activity:   . Days of Exercise per Week:   . Minutes of Exercise per Session:   Stress:   . Feeling of Stress :   Social Connections:   . Frequency of Communication with Friends and Family:   . Frequency of Social Gatherings with Friends and Family:   . Attends Religious Services:   . Active Member of Clubs or Organizations:   . Attends Banker Meetings:   Marland Kitchen Marital Status:    History reviewed. No pertinent family history. Allergies  Allergen Reactions  . Lisinopril Itching   Prior to Admission medications   Medication Sig Start Date End Date Taking? Authorizing Provider  Lansoprazole (PREVACID PO) Take by mouth.  12/28/19  [provider]   DG Forearm Right  Result Date: 12/28/2019 CLINICAL DATA:  Laceration to forearm from glass. EXAM: RIGHT FOREARM - 2 VIEW COMPARISON:  Report from remote elbow radiograph 12/27/2002 FINDINGS: Remote radial head fracture. No evidence of acute fracture. Wrist alignment is maintained. Soft tissue edema in the mid forearm with overlying dressing in place, likely site of laceration. No radiopaque  foreign body. IMPRESSION: Soft tissue edema in the mid forearm with overlying dressing in place, likely site of laceration. No radiopaque foreign body or acute osseous abnormality. Electronically Signed   By: Narda Rutherford M.D.   On: 12/28/2019 02:24   CT ANGIO UP EXTREM RIGHT W &/OR WO CONTRAST  Result Date: 12/28/2019 CLINICAL DATA:  Right forearm laceration with expanding hematoma. Extensor deficit. EXAM: CT ANGIOGRAPHY OF THE RIGHT UPPER EXTREMITY TECHNIQUE: Multidetector CT imaging of the right upper extremity was performed using the  standard protocol during bolus administration of intravenous contrast. Multiplanar CT image reconstructions and MIPs were obtained to evaluate the vascular anatomy. CONTRAST:  OMNIPAQUE IOHEXOL 350 MG/ML SOLN COMPARISON:  Radiograph earlier today. FINDINGS: The included right subclavian and axillary arteries are widely patent. Circumflex artery is widely patent. Brachial artery is widely patent. The radial and ulnar arteries are widely patent. No evidence of arterial injury. Palmar arteries are well opacified. There is a laceration involving the extensor compartment of the forearm with soft tissue and intramuscular air. In the region of laceration there is active extravasation within an intramuscular branch in the extensor compartment of the forearm, series 4, image 156. Few bubbles of gas track into the radial aspect of the forearm. There is associated skin irregularity. No acute fracture. No overlying dressing is in place. Atherosclerosis of the right superficial femoral artery is partially included in the field of view. Review of the MIP images confirms the above findings. IMPRESSION: 1. Small focus of active arterial extravasation from an intramuscular branch in the extensor compartment of the forearm in the region of laceration. No evidence of injury to the major arterial structures of the right upper extremity. 2. Laceration involving the extensor compartment with small foci of intramuscular gas and skin injury. These results were called by telephone at the time of interpretation on 12/28/2019 at 3:43 am to Dr Paula Libra , who verbally acknowledged these results. Electronically Signed   By: Narda Rutherford M.D.   On: 12/28/2019 03:43    Positive ROS: All other systems have been reviewed and were otherwise negative with the exception of those mentioned in the HPI and as above.  Physical Exam: Vitals: Refer to EMR. Constitutional:  WD, WN, NAD HEENT:  NCAT, EOMI Neuro/Psych:  Alert & oriented to  person, place, and time; appropriate mood & affect Lymphatic: No generalized extremity edema or lymphadenopathy Extremities / MSK:  The extremities are normal with respect to appearance, ranges of motion, joint stability, muscle strength/tone, sensation, & perfusion except as otherwise noted:  Clinical photographs reviewed.  Will examine patient at bedside upon arrival to [facility] the bedside, compressive dressing is on the laceration.  Fingers are warm, radial pulse palpable.  Intact light touch sensibility in the radial, median, and ulnar nerve distributions.  There is intact extension of the index and small fingers, as well as the thumb, but for long and ring finger MCP extension accompanied by fairly significant pain.  It seems as if attempts to extend the long and ring fingers because tensioning of the EDC to the index that I can palpate, but this finding is not fully certain.  Assessment: Right dorsal forearm laceration, into the muscle, with digital extension weakness and concerns for less than fully controlled hemorrhage that could contribute to development of compartment syndrome  Plan: This patient is being transferred from [facility] to [facility], for evaluation at that time, which may include operative exploration of the wound to control  hemorrhage with repair of other structures as indicated.  I discussed these findings with him in the role and rationale for the planned procedure.  Goals, risk, and options were reviewed and consent obtained, and the document was executed.  Rayvon Char [address], [address] [place], [place]  [address] Office: [phone] Mobile: [phone]  12/28/2019, 5:15 AM

## 2020-11-27 ENCOUNTER — Encounter (HOSPITAL_BASED_OUTPATIENT_CLINIC_OR_DEPARTMENT_OTHER): Payer: Self-pay | Admitting: Orthopedic Surgery

## 2020-11-27 ENCOUNTER — Other Ambulatory Visit: Payer: Self-pay

## 2020-11-27 NOTE — Progress Notes (Signed)
Spoke w/ via phone for pre-op interview---pt Lab needs dos---- none              Lab results------none COVID test ------12-01-2020 1000 am Arrive at -------1045 am 12-04-2020 NPO after MN NO Solid Food.  Clear liquids from MN until---945 am then npo Med rec completed Medications to take morning of surgery -----flonase Diabetic medication -----n/a Patient instructed to bring photo id and insurance card day of surgery Patient aware to have Driver (ride ) / caregiver girlfriend Joshua Farley will stay    for 24 hours after surgery  Patient Special Instructions -----none Pre-Op special Istructions -----none Patient verbalized understanding of instructions that were given at this phone interview. Patient denies shortness of breath, chest pain, fever, cough at this phone interview.

## 2020-12-01 ENCOUNTER — Other Ambulatory Visit (HOSPITAL_COMMUNITY)
Admission: RE | Admit: 2020-12-01 | Discharge: 2020-12-01 | Disposition: A | Discharge status: Home / Self Care | Payer: 59 | Source: Ambulatory Visit | Attending: Orthopedic Surgery | Admitting: Orthopedic Surgery

## 2020-12-01 DIAGNOSIS — Z20822 Contact with and (suspected) exposure to covid-19: Secondary | ICD-10-CM | POA: Insufficient documentation

## 2020-12-01 DIAGNOSIS — Z01812 Encounter for preprocedural laboratory examination: Secondary | ICD-10-CM | POA: Insufficient documentation

## 2020-12-02 LAB — SARS CORONAVIRUS 2 (TAT 6-24 HRS): SARS Coronavirus 2: NEGATIVE

## 2020-12-04 ENCOUNTER — Encounter (HOSPITAL_BASED_OUTPATIENT_CLINIC_OR_DEPARTMENT_OTHER)
Admission: RE | Disposition: A | Discharge status: Home / Self Care | Payer: Self-pay | Source: Home / Self Care | Attending: Orthopedic Surgery

## 2020-12-04 ENCOUNTER — Encounter (HOSPITAL_BASED_OUTPATIENT_CLINIC_OR_DEPARTMENT_OTHER): Payer: Self-pay | Admitting: Orthopedic Surgery

## 2020-12-04 ENCOUNTER — Other Ambulatory Visit: Payer: Self-pay

## 2020-12-04 ENCOUNTER — Ambulatory Visit (HOSPITAL_BASED_OUTPATIENT_CLINIC_OR_DEPARTMENT_OTHER)
Admission: RE | Admit: 2020-12-04 | Discharge: 2020-12-04 | Disposition: A | Discharge status: Home / Self Care | Payer: 59 | Attending: Orthopedic Surgery | Admitting: Orthopedic Surgery

## 2020-12-04 ENCOUNTER — Encounter (HOSPITAL_BASED_OUTPATIENT_CLINIC_OR_DEPARTMENT_OTHER): Payer: Self-pay | Admitting: Anesthesiology

## 2020-12-04 DIAGNOSIS — Z539 Procedure and treatment not carried out, unspecified reason: Secondary | ICD-10-CM | POA: Diagnosis not present

## 2020-12-04 DIAGNOSIS — I10 Essential (primary) hypertension: Secondary | ICD-10-CM | POA: Diagnosis not present

## 2020-12-04 DIAGNOSIS — M75102 Unspecified rotator cuff tear or rupture of left shoulder, not specified as traumatic: Secondary | ICD-10-CM | POA: Diagnosis present

## 2020-12-04 HISTORY — DX: Personal history of other diseases of the circulatory system: Z86.79

## 2020-12-04 HISTORY — DX: Complete rotator cuff tear or rupture of left shoulder, not specified as traumatic: M75.122

## 2020-12-04 SURGERY — SHOULDER ARTHROSCOPY WITH SUBACROMIAL DECOMPRESSION, ROTATOR CUFF REPAIR AND BICEP TENDON REPAIR
Anesthesia: Choice | Laterality: Left

## 2020-12-04 MED ORDER — CEFAZOLIN SODIUM-DEXTROSE 2-4 GM/100ML-% IV SOLN
INTRAVENOUS | Status: AC
  Filled 2020-12-04: qty 100

## 2020-12-04 MED ORDER — CELECOXIB 200 MG PO CAPS
ORAL_CAPSULE | ORAL | Status: AC
  Filled 2020-12-04: qty 1

## 2020-12-04 MED ORDER — LACTATED RINGERS IV SOLN: INTRAVENOUS | Status: DC

## 2020-12-04 MED ORDER — ONDANSETRON HCL 4 MG/2ML IJ SOLN
INTRAMUSCULAR | Status: AC
  Filled 2020-12-04: qty 2

## 2020-12-04 MED ORDER — HYDRALAZINE HCL 20 MG/ML IJ SOLN
INTRAMUSCULAR | Status: AC
  Filled 2020-12-04: qty 1

## 2020-12-04 MED ORDER — ACETAMINOPHEN 500 MG PO TABS
1000.0000 mg | ORAL_TABLET | Freq: Once | ORAL | Status: AC
  Administered 2020-12-04: 1000 mg via ORAL

## 2020-12-04 MED ORDER — PROPOFOL 10 MG/ML IV BOLUS
INTRAVENOUS | Status: AC
  Filled 2020-12-04: qty 20

## 2020-12-04 MED ORDER — LIDOCAINE 2% (20 MG/ML) 5 ML SYRINGE
INTRAMUSCULAR | Status: AC
  Filled 2020-12-04: qty 5

## 2020-12-04 MED ORDER — CEFAZOLIN SODIUM-DEXTROSE 2-4 GM/100ML-% IV SOLN: 2.0000 g | INTRAVENOUS | Status: DC

## 2020-12-04 MED ORDER — FENTANYL CITRATE (PF) 100 MCG/2ML IJ SOLN
INTRAMUSCULAR | Status: AC
  Filled 2020-12-04: qty 2

## 2020-12-04 MED ORDER — DEXAMETHASONE SODIUM PHOSPHATE 10 MG/ML IJ SOLN
INTRAMUSCULAR | Status: AC
  Filled 2020-12-04: qty 1

## 2020-12-04 MED ORDER — CELECOXIB 200 MG PO CAPS
200.0000 mg | ORAL_CAPSULE | Freq: Once | ORAL | Status: AC
  Administered 2020-12-04: 200 mg via ORAL

## 2020-12-04 MED ORDER — ACETAMINOPHEN 500 MG PO TABS
ORAL_TABLET | ORAL | Status: AC
  Filled 2020-12-04: qty 2

## 2020-12-04 MED ORDER — HYDRALAZINE HCL 20 MG/ML IJ SOLN
10.0000 mg | Freq: Once | INTRAMUSCULAR | Status: AC
  Administered 2020-12-04: 10 mg via INTRAVENOUS

## 2020-12-04 SURGICAL SUPPLY — 72 items
BLADE SURG 11 STRL SS (BLADE) IMPLANT
BLADE SURG 15 STRL LF DISP TIS (BLADE) IMPLANT
BLADE SURG 15 STRL SS (BLADE)
BURR OVAL 8 FLU 4.0X13 (MISCELLANEOUS) IMPLANT
CANNULA 5.75X7 CRYSTAL CLEAR (CANNULA) IMPLANT
CANNULA 5.75X71 LONG (CANNULA) IMPLANT
CANNULA TWIST IN 8.25X7CM (CANNULA) IMPLANT
CONNECTOR 5 IN 1 STRAIGHT STRL (MISCELLANEOUS) IMPLANT
COVER WAND RF STERILE (DRAPES) IMPLANT
DISSECTOR  3.8MM X 13CM (MISCELLANEOUS)
DISSECTOR 3.8MM X 13CM (MISCELLANEOUS) IMPLANT
DRAPE ORTHO SPLIT 77X108 STRL (DRAPES)
DRAPE POUCH INSTRU U-SHP 10X18 (DRAPES) IMPLANT
DRAPE SHEET LG 3/4 BI-LAMINATE (DRAPES) IMPLANT
DRAPE STERI 35X30 U-POUCH (DRAPES) IMPLANT
DRAPE SURG 17X23 STRL (DRAPES) IMPLANT
DRAPE SURG ORHT 6 SPLT 77X108 (DRAPES) IMPLANT
DRAPE U-SHAPE 47X51 STRL (DRAPES) IMPLANT
DRSG EMULSION OIL 3X3 NADH (GAUZE/BANDAGES/DRESSINGS) IMPLANT
DRSG PAD ABDOMINAL 8X10 ST (GAUZE/BANDAGES/DRESSINGS) IMPLANT
DURAPREP 26ML APPLICATOR (WOUND CARE) IMPLANT
ELECT REM PT RETURN 9FT ADLT (ELECTROSURGICAL)
ELECTRODE REM PT RTRN 9FT ADLT (ELECTROSURGICAL) IMPLANT
FIBERSTICK 2 (SUTURE) IMPLANT
GAUZE SPONGE 4X4 12PLY STRL (GAUZE/BANDAGES/DRESSINGS) IMPLANT
GLOVE SURG ENC MOIS LTX SZ7.5 (GLOVE) IMPLANT
GLOVE SURG UNDER LTX SZ8 (GLOVE) IMPLANT
GOWN STRL REUS W/TWL LRG LVL3 (GOWN DISPOSABLE) IMPLANT
IV NS IRRIG 3000ML ARTHROMATIC (IV SOLUTION) IMPLANT
KIT TURNOVER CYSTO (KITS) IMPLANT
LASSO SUT 90 DEGREE (SUTURE) IMPLANT
LOOP 2 FIBERLINK CLOSED (SUTURE) IMPLANT
MANIFOLD NEPTUNE II (INSTRUMENTS) IMPLANT
NDL SAFETY ECLIPSE 18X1.5 (NEEDLE) IMPLANT
NEEDLE FILTER BLUNT 18X 1/2SAF (NEEDLE)
NEEDLE FILTER BLUNT 18X1 1/2 (NEEDLE) IMPLANT
NEEDLE HYPO 18GX1.5 SHARP (NEEDLE)
NEEDLE MAYO 6 CRC TAPER PT (NEEDLE) IMPLANT
NEEDLE MAYO CATGUT SZ4 (NEEDLE) IMPLANT
NEEDLE SCORPION MULTI FIRE (NEEDLE) IMPLANT
NS IRRIG 500ML POUR BTL (IV SOLUTION) IMPLANT
PACK ARTHROSCOPY DSU (CUSTOM PROCEDURE TRAY) IMPLANT
PACK BASIN DAY SURGERY FS (CUSTOM PROCEDURE TRAY) IMPLANT
PAD ARMBOARD 7.5X6 YLW CONV (MISCELLANEOUS) IMPLANT
PENCIL SMOKE EVACUATOR (MISCELLANEOUS) IMPLANT
PROBE APOLLO 90XL (SURGICAL WAND) IMPLANT
SLEEVE ARM SUSPENSION SYSTEM (MISCELLANEOUS) IMPLANT
SLING S3 LATERAL DISP (MISCELLANEOUS) IMPLANT
SLING ULTRA II AB L (ORTHOPEDIC SUPPLIES) IMPLANT
SPONGE LAP 4X18 RFD (DISPOSABLE) IMPLANT
SUCTION FRAZIER HANDLE 10FR (MISCELLANEOUS)
SUCTION TUBE FRAZIER 10FR DISP (MISCELLANEOUS) IMPLANT
SUT 2 FIBERLOOP 20 STRT BLUE (SUTURE)
SUT ETHILON 3 0 PS 1 (SUTURE) IMPLANT
SUT FIBERWIRE #2 38 T-5 BLUE (SUTURE)
SUT LASSO 45 DEGREE LEFT (SUTURE) IMPLANT
SUT LASSO 45D RIGHT (SUTURE) IMPLANT
SUT MNCRL AB 3-0 PS2 27 (SUTURE) IMPLANT
SUT PDS AB 0 CT1 36 (SUTURE) IMPLANT
SUT TIGER TAPE 7 IN WHITE (SUTURE) IMPLANT
SUT VIC AB 0 CT1 36 (SUTURE) IMPLANT
SUT VIC AB 2-0 CT1 27 (SUTURE)
SUT VIC AB 2-0 CT1 TAPERPNT 27 (SUTURE) IMPLANT
SUTURE 2 FIBERLOOP 20 STRT BLU (SUTURE) IMPLANT
SUTURE FIBERWR #2 38 T-5 BLUE (SUTURE) IMPLANT
SYR CONTROL 10ML LL (SYRINGE) IMPLANT
SYR TB 1ML LL NO SAFETY (SYRINGE) IMPLANT
TAPE CLOTH SURG 6X10 WHT LF (GAUZE/BANDAGES/DRESSINGS) IMPLANT
TOWEL OR 17X26 10 PK STRL BLUE (TOWEL DISPOSABLE) IMPLANT
TUBE CONNECTING 12X1/4 (SUCTIONS) IMPLANT
TUBING ARTHROSCOPY IRRIG 16FT (MISCELLANEOUS) IMPLANT
YANKAUER SUCT BULB TIP NO VENT (SUCTIONS) IMPLANT

## 2020-12-04 NOTE — Anesthesia Preprocedure Evaluation (Deleted)
Anesthesia Evaluation    Reviewed: Allergy & Precautions, Patient's Chart, lab work & pertinent test results  History of Anesthesia Complications Negative for: history of anesthetic complications  Airway        Dental   Pulmonary Current Smoker and Patient abstained from smoking.,           Cardiovascular hypertension (no meds currently),      Neuro/Psych negative psych ROS   GI/Hepatic Neg liver ROS, GERD  Controlled,  Endo/Other  negative endocrine ROS  Renal/GU Renal InsufficiencyRenal disease     Musculoskeletal negative musculoskeletal ROS (+)   Abdominal   Peds  Hematology negative hematology ROS (+)   Anesthesia Other Findings   Reproductive/Obstetrics                            Anesthesia Physical  Anesthesia Plan  ASA: II  Anesthesia Plan: General   Post-op Pain Management:  Regional for Post-op pain   Induction: Intravenous  PONV Risk Score and Plan: 2 and Treatment may vary due to age or medical condition, Ondansetron, Dexamethasone and Midazolam  Airway Management Planned: Oral ETT and LMA  Additional Equipment: None  Intra-op Plan:   Post-operative Plan:   Informed Consent:   Plan Discussed with: Anesthesiologist  Anesthesia Plan Comments: (Case is cancelled due to pre-op hypertension.  I counseled the pt to seek care with his primary care physician and restart medications.  He voiced an understanding and agreed to this plan.)       Anesthesia Quick Evaluation

## 2020-12-04 NOTE — Progress Notes (Signed)
Dr. Adonis Huguenin notified of patient's BP orders to be given for hydralazine once IV started. Dr. Krista Blue by to see patient and discuss options due to HTN. Surgery cancelled.

## 2020-12-11 ENCOUNTER — Other Ambulatory Visit: Payer: Self-pay | Admitting: Otolaryngology

## 2020-12-17 ENCOUNTER — Other Ambulatory Visit: Payer: Self-pay

## 2020-12-17 ENCOUNTER — Encounter (HOSPITAL_BASED_OUTPATIENT_CLINIC_OR_DEPARTMENT_OTHER): Payer: Self-pay | Admitting: Otolaryngology

## 2020-12-23 NOTE — Anesthesia Preprocedure Evaluation (Addendum)
Anesthesia Evaluation  Patient identified by MRN, date of birth, ID band Patient awake    Reviewed: Allergy & Precautions, NPO status , Patient's Chart, lab work & pertinent test results  Airway Mallampati: III  TM Distance: >3 FB Neck ROM: Full    Dental no notable dental hx. (+) Teeth Intact, Dental Advisory Given   Pulmonary Current Smoker and Patient abstained from smoking.,  20 pack year history  Snores, no witnessed apneas, never had sleep study    Pulmonary exam normal breath sounds clear to auscultation       Cardiovascular hypertension, Pt. on medications Normal cardiovascular exam Rhythm:Regular Rate:Normal     Neuro/Psych negative neurological ROS  negative psych ROS   GI/Hepatic Neg liver ROS, GERD  Medicated and Controlled,  Endo/Other  Obesity BMI 32  Renal/GU negative Renal ROS  negative genitourinary   Musculoskeletal negative musculoskeletal ROS (+)   Abdominal   Peds  Hematology negative hematology ROS (+)   Anesthesia Other Findings Deviated septum, bilateral turbinate hypertrophy  Reproductive/Obstetrics negative OB ROS                            Anesthesia Physical Anesthesia Plan  ASA: II  Anesthesia Plan: General   Post-op Pain Management:    Induction: Intravenous  PONV Risk Score and Plan: 2 and Ondansetron, Dexamethasone, Midazolam and Treatment may vary due to age or medical condition  Airway Management Planned: Oral ETT  Additional Equipment: None  Intra-op Plan:   Post-operative Plan: Extubation in OR  Informed Consent: I have reviewed the patients History and Physical, chart, labs and discussed the procedure including the risks, benefits and alternatives for the proposed anesthesia with the patient or authorized representative who has indicated his/her understanding and acceptance.     Dental advisory given  Plan Discussed with:  CRNA  Anesthesia Plan Comments:        Anesthesia Quick Evaluation

## 2020-12-24 ENCOUNTER — Other Ambulatory Visit: Payer: Self-pay

## 2020-12-24 ENCOUNTER — Encounter (HOSPITAL_BASED_OUTPATIENT_CLINIC_OR_DEPARTMENT_OTHER)
Admission: RE | Admit: 2020-12-24 | Discharge: 2020-12-24 | Disposition: A | Discharge status: Home / Self Care | Payer: 59 | Source: Ambulatory Visit | Attending: Otolaryngology | Admitting: Otolaryngology

## 2020-12-24 DIAGNOSIS — J3489 Other specified disorders of nose and nasal sinuses: Secondary | ICD-10-CM | POA: Diagnosis not present

## 2020-12-24 DIAGNOSIS — J310 Chronic rhinitis: Secondary | ICD-10-CM | POA: Diagnosis not present

## 2020-12-24 DIAGNOSIS — F17200 Nicotine dependence, unspecified, uncomplicated: Secondary | ICD-10-CM | POA: Diagnosis not present

## 2020-12-24 DIAGNOSIS — H903 Sensorineural hearing loss, bilateral: Secondary | ICD-10-CM | POA: Diagnosis not present

## 2020-12-24 DIAGNOSIS — J342 Deviated nasal septum: Secondary | ICD-10-CM | POA: Diagnosis not present

## 2020-12-24 DIAGNOSIS — J343 Hypertrophy of nasal turbinates: Secondary | ICD-10-CM | POA: Diagnosis not present

## 2020-12-25 ENCOUNTER — Ambulatory Visit (HOSPITAL_BASED_OUTPATIENT_CLINIC_OR_DEPARTMENT_OTHER): Payer: 59 | Admitting: Anesthesiology

## 2020-12-25 ENCOUNTER — Ambulatory Visit (HOSPITAL_BASED_OUTPATIENT_CLINIC_OR_DEPARTMENT_OTHER)
Admission: RE | Admit: 2020-12-25 | Discharge: 2020-12-25 | Disposition: A | Discharge status: Home / Self Care | Payer: 59 | Attending: Otolaryngology | Admitting: Otolaryngology

## 2020-12-25 ENCOUNTER — Encounter (HOSPITAL_BASED_OUTPATIENT_CLINIC_OR_DEPARTMENT_OTHER)
Admission: RE | Disposition: A | Discharge status: Home / Self Care | Payer: Self-pay | Source: Home / Self Care | Attending: Otolaryngology

## 2020-12-25 ENCOUNTER — Encounter (HOSPITAL_BASED_OUTPATIENT_CLINIC_OR_DEPARTMENT_OTHER): Payer: Self-pay | Admitting: Otolaryngology

## 2020-12-25 ENCOUNTER — Other Ambulatory Visit: Payer: Self-pay

## 2020-12-25 DIAGNOSIS — J342 Deviated nasal septum: Secondary | ICD-10-CM | POA: Insufficient documentation

## 2020-12-25 DIAGNOSIS — J3489 Other specified disorders of nose and nasal sinuses: Secondary | ICD-10-CM | POA: Insufficient documentation

## 2020-12-25 DIAGNOSIS — J310 Chronic rhinitis: Secondary | ICD-10-CM | POA: Diagnosis not present

## 2020-12-25 DIAGNOSIS — F17200 Nicotine dependence, unspecified, uncomplicated: Secondary | ICD-10-CM | POA: Insufficient documentation

## 2020-12-25 DIAGNOSIS — J343 Hypertrophy of nasal turbinates: Secondary | ICD-10-CM | POA: Diagnosis not present

## 2020-12-25 DIAGNOSIS — H903 Sensorineural hearing loss, bilateral: Secondary | ICD-10-CM | POA: Insufficient documentation

## 2020-12-25 HISTORY — PX: NASAL SEPTOPLASTY W/ TURBINOPLASTY: SHX2070

## 2020-12-25 SURGERY — SEPTOPLASTY, NOSE, WITH NASAL TURBINATE REDUCTION
Anesthesia: General | Site: Nose | Laterality: Bilateral

## 2020-12-25 MED ORDER — HYDROMORPHONE HCL 1 MG/ML IJ SOLN
INTRAMUSCULAR | Status: AC
  Filled 2020-12-25: qty 0.5

## 2020-12-25 MED ORDER — HYDRALAZINE HCL 20 MG/ML IJ SOLN
10.0000 mg | Freq: Once | INTRAMUSCULAR | Status: AC
  Administered 2020-12-25: 10 mg via INTRAVENOUS

## 2020-12-25 MED ORDER — OXYMETAZOLINE HCL 0.05 % NA SOLN
NASAL | Status: DC | PRN
  Administered 2020-12-25: 1 via TOPICAL

## 2020-12-25 MED ORDER — PROMETHAZINE HCL 25 MG/ML IJ SOLN: 6.2500 mg | INTRAMUSCULAR | Status: DC | PRN

## 2020-12-25 MED ORDER — MIDAZOLAM HCL 5 MG/5ML IJ SOLN
INTRAMUSCULAR | Status: DC | PRN
  Administered 2020-12-25: 2 mg via INTRAVENOUS

## 2020-12-25 MED ORDER — SUGAMMADEX SODIUM 200 MG/2ML IV SOLN
INTRAVENOUS | Status: DC | PRN
  Administered 2020-12-25: 300 mg via INTRAVENOUS

## 2020-12-25 MED ORDER — ACETAMINOPHEN 500 MG PO TABS
1000.0000 mg | ORAL_TABLET | Freq: Once | ORAL | Status: AC
  Administered 2020-12-25: 1000 mg via ORAL

## 2020-12-25 MED ORDER — CEFAZOLIN SODIUM-DEXTROSE 2-3 GM-%(50ML) IV SOLR
INTRAVENOUS | Status: DC | PRN
  Administered 2020-12-25: 2 g via INTRAVENOUS

## 2020-12-25 MED ORDER — PHENYLEPHRINE 40 MCG/ML (10ML) SYRINGE FOR IV PUSH (FOR BLOOD PRESSURE SUPPORT)
PREFILLED_SYRINGE | INTRAVENOUS | Status: AC
  Filled 2020-12-25: qty 50

## 2020-12-25 MED ORDER — OXYCODONE-ACETAMINOPHEN 5-325 MG PO TABS: 1.0000 | ORAL_TABLET | ORAL | 0 refills | Status: AC | PRN

## 2020-12-25 MED ORDER — HYDROMORPHONE HCL 1 MG/ML IJ SOLN
0.2500 mg | INTRAMUSCULAR | Status: DC | PRN
  Administered 2020-12-25 (×3): 0.5 mg via INTRAVENOUS

## 2020-12-25 MED ORDER — MUPIROCIN 2 % EX OINT
TOPICAL_OINTMENT | CUTANEOUS | Status: DC | PRN
  Administered 2020-12-25: 1 via NASAL

## 2020-12-25 MED ORDER — PHENYLEPHRINE 40 MCG/ML (10ML) SYRINGE FOR IV PUSH (FOR BLOOD PRESSURE SUPPORT)
PREFILLED_SYRINGE | INTRAVENOUS | Status: DC | PRN
  Administered 2020-12-25 (×2): 40 ug via INTRAVENOUS

## 2020-12-25 MED ORDER — CEFAZOLIN SODIUM 1 G IJ SOLR
INTRAMUSCULAR | Status: AC
  Filled 2020-12-25: qty 20

## 2020-12-25 MED ORDER — DIPHENHYDRAMINE HCL 50 MG/ML IJ SOLN
INTRAMUSCULAR | Status: AC
  Filled 2020-12-25: qty 1

## 2020-12-25 MED ORDER — DEXAMETHASONE SODIUM PHOSPHATE 10 MG/ML IJ SOLN
INTRAMUSCULAR | Status: AC
  Filled 2020-12-25: qty 1

## 2020-12-25 MED ORDER — PROPOFOL 10 MG/ML IV BOLUS
INTRAVENOUS | Status: AC
  Filled 2020-12-25: qty 20

## 2020-12-25 MED ORDER — LIDOCAINE 2% (20 MG/ML) 5 ML SYRINGE
INTRAMUSCULAR | Status: DC | PRN
  Administered 2020-12-25: 80 mg via INTRAVENOUS

## 2020-12-25 MED ORDER — DEXMEDETOMIDINE (PRECEDEX) IN NS 20 MCG/5ML (4 MCG/ML) IV SYRINGE
PREFILLED_SYRINGE | INTRAVENOUS | Status: AC
  Filled 2020-12-25: qty 5

## 2020-12-25 MED ORDER — FENTANYL CITRATE (PF) 100 MCG/2ML IJ SOLN
INTRAMUSCULAR | Status: DC | PRN
  Administered 2020-12-25 (×2): 50 ug via INTRAVENOUS
  Administered 2020-12-25: 25 ug via INTRAVENOUS

## 2020-12-25 MED ORDER — ROCURONIUM BROMIDE 10 MG/ML (PF) SYRINGE
PREFILLED_SYRINGE | INTRAVENOUS | Status: AC
  Filled 2020-12-25: qty 10

## 2020-12-25 MED ORDER — ROCURONIUM BROMIDE 100 MG/10ML IV SOLN
INTRAVENOUS | Status: DC | PRN
  Administered 2020-12-25: 80 mg via INTRAVENOUS

## 2020-12-25 MED ORDER — HYDRALAZINE HCL 20 MG/ML IJ SOLN
INTRAMUSCULAR | Status: AC
  Filled 2020-12-25: qty 1

## 2020-12-25 MED ORDER — OXYCODONE HCL 5 MG PO TABS: 5.0000 mg | ORAL_TABLET | Freq: Once | ORAL | Status: DC | PRN

## 2020-12-25 MED ORDER — LACTATED RINGERS IV SOLN: INTRAVENOUS | Status: DC

## 2020-12-25 MED ORDER — AMOXICILLIN 875 MG PO TABS: 875.0000 mg | ORAL_TABLET | Freq: Two times a day (BID) | ORAL | 0 refills | Status: AC

## 2020-12-25 MED ORDER — OXYCODONE HCL 5 MG/5ML PO SOLN
5.0000 mg | Freq: Once | ORAL | Status: DC | PRN
Start: 2020-12-25 — End: 2020-12-25

## 2020-12-25 MED ORDER — ONDANSETRON HCL 4 MG/2ML IJ SOLN
INTRAMUSCULAR | Status: AC
  Filled 2020-12-25: qty 2

## 2020-12-25 MED ORDER — PROPOFOL 10 MG/ML IV BOLUS
INTRAVENOUS | Status: DC | PRN
  Administered 2020-12-25: 200 mg via INTRAVENOUS

## 2020-12-25 MED ORDER — DEXMEDETOMIDINE (PRECEDEX) IN NS 20 MCG/5ML (4 MCG/ML) IV SYRINGE
PREFILLED_SYRINGE | INTRAVENOUS | Status: DC | PRN
  Administered 2020-12-25 (×5): 4 ug via INTRAVENOUS

## 2020-12-25 MED ORDER — FENTANYL CITRATE (PF) 100 MCG/2ML IJ SOLN
INTRAMUSCULAR | Status: AC
  Filled 2020-12-25: qty 2

## 2020-12-25 MED ORDER — LIDOCAINE-EPINEPHRINE 1 %-1:100000 IJ SOLN
INTRAMUSCULAR | Status: DC | PRN
  Administered 2020-12-25: 3 mL

## 2020-12-25 MED ORDER — ONDANSETRON HCL 4 MG/2ML IJ SOLN
INTRAMUSCULAR | Status: DC | PRN
  Administered 2020-12-25: 4 mg via INTRAVENOUS

## 2020-12-25 MED ORDER — DEXAMETHASONE SODIUM PHOSPHATE 10 MG/ML IJ SOLN
INTRAMUSCULAR | Status: DC | PRN
  Administered 2020-12-25: 10 mg via INTRAVENOUS

## 2020-12-25 MED ORDER — ACETAMINOPHEN 500 MG PO TABS
ORAL_TABLET | ORAL | Status: AC
  Filled 2020-12-25: qty 2

## 2020-12-25 MED ORDER — MIDAZOLAM HCL 2 MG/2ML IJ SOLN
INTRAMUSCULAR | Status: AC
  Filled 2020-12-25: qty 2

## 2020-12-25 MED ORDER — LIDOCAINE 2% (20 MG/ML) 5 ML SYRINGE
INTRAMUSCULAR | Status: AC
  Filled 2020-12-25: qty 5

## 2020-12-25 SURGICAL SUPPLY — 35 items
ATTRACTOMAT 16X20 MAGNETIC DRP (DRAPES) IMPLANT
CANISTER SUCT 1200ML W/VALVE (MISCELLANEOUS) ×2 IMPLANT
COAGULATOR SUCT 8FR VV (MISCELLANEOUS) ×2 IMPLANT
COVER WAND RF STERILE (DRAPES) IMPLANT
DECANTER SPIKE VIAL GLASS SM (MISCELLANEOUS) ×2 IMPLANT
DRSG NASOPORE 8CM (GAUZE/BANDAGES/DRESSINGS) IMPLANT
DRSG TELFA 3X8 NADH (GAUZE/BANDAGES/DRESSINGS) IMPLANT
ELECT REM PT RETURN 9FT ADLT (ELECTROSURGICAL) ×2
ELECTRODE REM PT RTRN 9FT ADLT (ELECTROSURGICAL) ×1 IMPLANT
GLOVE SURG ENC MOIS LTX SZ7.5 (GLOVE) ×2 IMPLANT
GLOVE SURG POLYISO LF SZ6.5 (GLOVE) ×4 IMPLANT
GLOVE SURG UNDER POLY LF SZ6.5 (GLOVE) ×2 IMPLANT
GLOVE SURG UNDER POLY LF SZ7 (GLOVE) ×2 IMPLANT
GOWN STRL REUS W/ TWL LRG LVL3 (GOWN DISPOSABLE) ×2 IMPLANT
GOWN STRL REUS W/ TWL XL LVL3 (GOWN DISPOSABLE) ×1 IMPLANT
GOWN STRL REUS W/TWL LRG LVL3 (GOWN DISPOSABLE) ×4
GOWN STRL REUS W/TWL XL LVL3 (GOWN DISPOSABLE) ×2
NEEDLE HYPO 25X1 1.5 SAFETY (NEEDLE) ×2 IMPLANT
NS IRRIG 1000ML POUR BTL (IV SOLUTION) ×2 IMPLANT
PACK BASIN DAY SURGERY FS (CUSTOM PROCEDURE TRAY) ×2 IMPLANT
PACK ENT DAY SURGERY (CUSTOM PROCEDURE TRAY) ×2 IMPLANT
SLEEVE SCD COMPRESS KNEE MED (STOCKING) ×2 IMPLANT
SOLUTION BUTLER CLEAR DIP (MISCELLANEOUS) ×2 IMPLANT
SPLINT NASAL AIRWAY SILICONE (MISCELLANEOUS) ×2 IMPLANT
SPONGE GAUZE 2X2 8PLY STRL LF (GAUZE/BANDAGES/DRESSINGS) ×2 IMPLANT
SPONGE NEURO XRAY DETECT 1X3 (DISPOSABLE) ×2 IMPLANT
SUT CHROMIC 4 0 P 3 18 (SUTURE) ×2 IMPLANT
SUT PLAIN 4 0 ~~LOC~~ 1 (SUTURE) ×2 IMPLANT
SUT PROLENE 3 0 PS 2 (SUTURE) ×2 IMPLANT
SUT VIC AB 4-0 P-3 18XBRD (SUTURE) IMPLANT
SUT VIC AB 4-0 P3 18 (SUTURE)
TOWEL GREEN STERILE FF (TOWEL DISPOSABLE) ×2 IMPLANT
TUBE SALEM SUMP 12R W/ARV (TUBING) IMPLANT
TUBE SALEM SUMP 16 FR W/ARV (TUBING) ×2 IMPLANT
YANKAUER SUCT BULB TIP NO VENT (SUCTIONS) ×2 IMPLANT

## 2020-12-25 NOTE — Transfer of Care (Signed)
Immediate Anesthesia Transfer of Care Note  Patient: [facility]  Procedure(s) Performed: NASAL SEPTOPLASTY WITH BILATERAL TURBINATE REDUCTION (Bilateral Nose)  Patient Location: PACU  Anesthesia Type:General  Level of Consciousness: drowsy and patient cooperative  Airway & Oxygen Therapy: Patient Spontanous Breathing and Patient connected to face mask oxygen  Post-op Assessment: Report given to RN and Post -op Vital signs reviewed and stable  Post vital signs: Reviewed and stable  Last Vitals:  Vitals Value Taken Time  BP 171/102 12/25/20 1017  Temp 36.6 C 12/25/20 1016  Pulse 65 12/25/20 1019  Resp 12 12/25/20 1019  SpO2 100 % 12/25/20 1019  Vitals shown include unvalidated device data.  Last Pain:  Vitals:   12/25/20 0752  PainSc: 0-No pain      Patients Stated Pain Goal: 2 (12/25/20 0752)  Complications: No complications documented.

## 2020-12-25 NOTE — Anesthesia Procedure Notes (Signed)
Procedure Name: Intubation Date/Time: 12/25/2020 9:05 AM Performed by: Gwyndolyn Saxon, CRNA Pre-anesthesia Checklist: Patient identified, Emergency Drugs available, Suction available and Patient being monitored Patient Re-evaluated:Patient Re-evaluated prior to induction Oxygen Delivery Method: Circle system utilized Preoxygenation: Pre-oxygenation with 100% oxygen Induction Type: IV induction Ventilation: Mask ventilation without difficulty and Oral airway inserted - appropriate to patient size Laryngoscope Size: Mac and 4 Grade View: Grade II Tube type: Oral Tube size: 7.5 mm Number of attempts: 2 Airway Equipment and Method: Patient positioned with wedge pillow and Stylet Placement Confirmation: ETT inserted through vocal cords under direct vision,  positive ETCO2 and breath sounds checked- equal and bilateral Secured at: 25 cm Tube secured with: Tape Dental Injury: Teeth and Oropharynx as per pre-operative assessment  Comments: DL#1 with miller 2; unable to reach epiglottis. DL #2 with MAC 4; grade 2/3 view with cricoid pressure

## 2020-12-25 NOTE — Op Note (Signed)
DATE OF PROCEDURE: 12/25/2020  OPERATIVE REPORT   SURGEON: Newman Pies, MD   PREOPERATIVE DIAGNOSES:  1. Severe nasal septal deviation.  2. Bilateral inferior turbinate hypertrophy.  3. Chronic nasal obstruction.  POSTOPERATIVE DIAGNOSES:  1. Severe nasal septal deviation.  2. Bilateral inferior turbinate hypertrophy.  3. Chronic nasal obstruction.  PROCEDURE PERFORMED:  1. Septoplasty.  2. Bilateral partial inferior turbinate resection.   ANESTHESIA: General endotracheal tube anesthesia.   COMPLICATIONS: None.   ESTIMATED BLOOD LOSS: 50 mL.   INDICATION FOR PROCEDURE: Joshua Farley is a 50 y.o. male with a history of chronic nasal obstruction. The patient was treated with antihistamine, decongestant, and steroid nasal sprays. However, the patient continued to be symptomatic. On examination, the patient was noted to have bilateral severe inferior turbinate hypertrophy and significant nasal septal deviation, causing significant nasal obstruction. Based on the above findings, the decision was made for the patient to undergo the above-stated procedures. The risks, benefits, alternatives, and details of the procedures were discussed with the patient. Questions were invited and answered. Informed consent was obtained.   DESCRIPTION OF PROCEDURE: The patient was taken to the operating room and placed supine on the operating table. General endotracheal tube anesthesia was administered by the anesthesiologist. The patient was positioned, and prepped and draped in the standard fashion for nasal surgery. Pledgets soaked with Afrin were placed in both nasal cavities for decongestion. The pledgets were subsequently removed.  Examination of the nasal cavity revealed a severe nasal septal deviation. 1% lidocaine with 1:100,000 epinephrine was injected onto the nasal septum bilaterally. A hemitransfixion incision was made on the left side. The mucosal flap was carefully elevated on the left side. A  cartilaginous incision was made 1 cm superior to the caudal margin of the nasal septum. Mucosal flap was also elevated on the right side in the similar fashion. It should be noted that due to the severe septal deviation, the deviated portion of the cartilaginous and bony septum had to be removed in piecemeal fashion. Once the deviated portions were removed, a straight midline septum was achieved. The septum was then quilted with 4-0 plain gut sutures. The hemitransfixion incision was closed with interrupted 4-0 chromic sutures.   The inferior one half of both hypertrophied inferior turbinate was crossclamped with a Kelly clamp. The inferior one half of each inferior turbinate was then resected with a pair of cross cutting scissors. Hemostasis was achieved with a suction cautery device. Doyle splints were applied to the nasal septum.  The care of the patient was turned over to the anesthesiologist. The patient was awakened from anesthesia without difficulty. The patient was extubated and transferred to the recovery room in good condition.   OPERATIVE FINDINGS: Severe nasal septal deviation and bilateral inferior turbinate hypertrophy.   SPECIMEN: None.   FOLLOWUP CARE: The patient be discharged home once he is awake and alert. The patient will be placed on Percocet 1 tablets p.o. q.4 hours p.r.n. pain, and amoxicillin 875 mg p.o. b.i.d. for 3 days. The patient will follow up in my office in 4 days for splint removal.   Joshua Farley Philomena Doheny, MD

## 2020-12-25 NOTE — Anesthesia Postprocedure Evaluation (Signed)
Anesthesia Post Note  Patient: [facility]  Procedure(s) Performed: NASAL SEPTOPLASTY WITH BILATERAL TURBINATE REDUCTION (Bilateral Nose)     Patient location during evaluation: PACU Anesthesia Type: General Level of consciousness: awake and alert, awake and oriented Pain management: pain level controlled Vital Signs Assessment: post-procedure vital signs reviewed and stable Respiratory status: spontaneous breathing, nonlabored ventilation and respiratory function stable Cardiovascular status: blood pressure returned to baseline and stable Postop Assessment: no apparent nausea or vomiting Anesthetic complications: no   No complications documented.  Last Vitals:  Vitals:   12/25/20 1053 12/25/20 1138  BP: 133/90 133/85  Pulse: 60 (!) 55  Resp: 19 20  Temp:  36.4 C  SpO2: 95% 96%    Last Pain:  Vitals:   12/25/20 1138  TempSrc: Oral  PainSc: 1                  Cecile Hearing

## 2020-12-25 NOTE — Discharge Instructions (Signed)
Post Anesthesia Home Care Instructions  Activity: Get plenty of rest for the remainder of the day. A responsible individual must stay with you for 24 hours following the procedure.  For the next 24 hours, DO NOT: -Drive a car -Operate machinery -Drink alcoholic beverages -Take any medication unless instructed by your physician -Make any legal decisions or sign important papers.  Meals: Start with liquid foods such as gelatin or soup. Progress to regular foods as tolerated. Avoid greasy, spicy, heavy foods. If nausea and/or vomiting occur, drink only clear liquids until the nausea and/or vomiting subsides. Call your physician if vomiting continues.  Special Instructions/Symptoms: Your throat may feel dry or sore from the anesthesia or the breathing tube placed in your throat during surgery. If this causes discomfort, gargle with warm salt water. The discomfort should disappear within 24 hours.  If you had a scopolamine patch placed behind your ear for the management of post- operative nausea and/or vomiting:  1. The medication in the patch is effective for 72 hours, after which it should be removed.  Wrap patch in a tissue and discard in the trash. Wash hands thoroughly with soap and water. 2. You may remove the patch earlier than 72 hours if you experience unpleasant side effects which may include dry mouth, dizziness or visual disturbances. 3. Avoid touching the patch. Wash your hands with soap and water after contact with the patch.    -----------------  POSTOPERATIVE INSTRUCTIONS FOR PATIENTS HAVING NASAL OR SINUS OPERATIONS ACTIVITY: Restrict activity at home for the first two days, resting as much as possible. Light activity is best. You may usually return to work within a week. You should refrain from nose blowing, strenuous activity, or heavy lifting greater than 20lbs for a total of one week after your operation.  If sneezing cannot be avoided, sneeze with your mouth  open. DISCOMFORT: You may experience a dull headache and pressure along with nasal congestion and discharge. These symptoms may be worse during the first week after the operation but may last as long as two to four weeks.  Please take Tylenol or the pain medication that has been prescribed for you. Do not take aspirin or aspirin containing medications since they may cause bleeding.  You may experience symptoms of post nasal drainage, nasal congestion, headaches and fatigue for two or three months after your operation.  BLEEDING: You may have some blood tinged nasal drainage for approximately two weeks after the operation.  The discharge will be worse for the first week.  Please call our office at [phone] or go to the nearest hospital emergency room if you experience any of the following: heavy, bright red blood from your nose or mouth that lasts longer than 15 minutes or coughing up or vomiting bright red blood or blood clots. GENERAL CONSIDERATIONS: 1. A gauze dressing will be placed on your upper lip to absorb any drainage after the operation. You may need to change this several times a day.  If you do not have very much drainage, you may remove the dressing.  Remember that you may gently wipe your nose with a tissue and sniff in, but DO NOT blow your nose. 2. Please keep all of your postoperative appointments.  Your final results after the operation will depend on proper follow-up.  The initial visit is usually 2 to 5 days after the operation.  During this visit, the remaining nasal packing and internal septal splints will be removed.  Your nasal and sinus cavities will   be cleaned.  During the second visit, your nasal and sinus cavities will be cleaned again. Have someone drive you to your first two postoperative appointments.  3. How you care for your nose after the operation will influence the results that you obtain.  You should follow all directions, take your medication as prescribed, and call  our office [phone] with any problems or questions. 4. You may be more comfortable sleeping with your head elevated on two pillows. 5. Do not take any medications that we have not prescribed or recommended. WARNING SIGNS: if any of the following should occur, please call our office: 1. Persistent fever greater than 102F. 2. Persistent vomiting. 3. Severe and constant pain that is not relieved by prescribed pain medication. 4. Trauma to the nose. 5. Rash or unusual side effects from any medicines.  

## 2020-12-25 NOTE — H&P (Signed)
Cc: Chronic nasal obstruction  HPI: The patient is a 50 y/o male who returns today for follow up evaluation of chronic nasal obstruction. The patient was last seen 6 weeks ago. He was noted to have bilateral inferior turbinate hypertrophy with septal deviation, resulting in significant nasal obstruction. The patient was placed on daily Flonase and a prednisone taper. He returns today noting no improvement in his nasal obstruction with the medication. The patient is also having frequent headaches. The patient has a new complaint today of possible hearing loss. He states his wife complains that he talks too loud. The patient denies any significant history of noise exposure. No otalgia, otorrhea, or vertigo is noted. No other ENT, GI, or respiratory issue noted since the last visit.   Exam: General: Communicates without difficulty, well nourished, no acute distress. Head: Normocephalic, no evidence injury, no tenderness, facial buttresses intact without stepoff. Eyes: PERRL, EOMI. No scleral icterus, conjunctivae clear. Neuro: CN II exam reveals vision grossly intact. No nystagmus at any point of gaze. Ears: Auricles well formed without lesions. Ear canals are intact without mass or lesion. No erythema or edema is appreciated. The TMs are intact without fluid. Nose: External evaluation reveals normal support and skin without lesions. Dorsum is intact. Anterior rhinoscopy reveals congested and edematous mucosa over anterior aspect of the inferior turbinates and nasal septum. No purulence is noted. Middle meatus is not well visualized. Oral:  Oral cavity and oropharynx are intact, symmetric, without erythema or edema. Mucosa is moist without lesions. Neck: Full range of motion without pain. There is no significant lymphadenopathy. No masses palpable. Thyroid bed within normal limits to palpation. Parotid glands and submandibular glands equal bilaterally without mass. Trachea is midline. Neuro:  CN 2-12 grossly  intact. Gait normal. Vestibular: No nystagmus at any point of gaze.   Assessment  1. Chronic rhinitis without evidence of acute sinusitis. No purulent drainage, polyps, or other suspicious mass or lesion is noted on today's nasal endoscopy. 2. 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 More than 95% of his nasal passageways are obstructed. 3. The patient's ear canals, tympanic membranes and middle ear spaces are all normal.  4. Bilateral high-frequency sensorineural hearing loss, consistent with presbycusis.   Plan  1. The physical exam, hearing test,  and nasal endoscopy findings are reviewed with the patient.  2. Treatment options for his nasal obstruction include conservative management with daily steroid nasal spray versus septoplasty and bilateral inferior turbinate reduction. The risks, benefits, alternatives, and details of the procedure are reviewed with the patient. Questions are invited and answered. 3. The patient is interested in proceeding with the procedure.  We will schedule the procedure in accordance with the family schedule.

## 2021-02-23 ENCOUNTER — Encounter (HOSPITAL_COMMUNITY)
Admission: RE | Admit: 2021-02-23 | Discharge: 2021-02-23 | Disposition: A | Discharge status: Home / Self Care | Payer: 59 | Source: Ambulatory Visit | Attending: Orthopedic Surgery | Admitting: Orthopedic Surgery

## 2021-02-23 ENCOUNTER — Other Ambulatory Visit: Payer: Self-pay

## 2021-02-23 ENCOUNTER — Encounter (HOSPITAL_COMMUNITY): Payer: Self-pay

## 2021-02-23 DIAGNOSIS — Z01812 Encounter for preprocedural laboratory examination: Secondary | ICD-10-CM | POA: Insufficient documentation

## 2021-02-23 DIAGNOSIS — F17210 Nicotine dependence, cigarettes, uncomplicated: Secondary | ICD-10-CM | POA: Insufficient documentation

## 2021-02-23 DIAGNOSIS — Z7901 Long term (current) use of anticoagulants: Secondary | ICD-10-CM | POA: Insufficient documentation

## 2021-02-23 DIAGNOSIS — Z79899 Other long term (current) drug therapy: Secondary | ICD-10-CM | POA: Insufficient documentation

## 2021-02-23 DIAGNOSIS — K219 Gastro-esophageal reflux disease without esophagitis: Secondary | ICD-10-CM | POA: Insufficient documentation

## 2021-02-23 DIAGNOSIS — I1310 Hypertensive heart and chronic kidney disease without heart failure, with stage 1 through stage 4 chronic kidney disease, or unspecified chronic kidney disease: Secondary | ICD-10-CM | POA: Insufficient documentation

## 2021-02-23 DIAGNOSIS — M75102 Unspecified rotator cuff tear or rupture of left shoulder, not specified as traumatic: Secondary | ICD-10-CM | POA: Diagnosis not present

## 2021-02-23 DIAGNOSIS — N189 Chronic kidney disease, unspecified: Secondary | ICD-10-CM | POA: Diagnosis not present

## 2021-02-23 HISTORY — DX: Chronic kidney disease, unspecified: N18.9

## 2021-02-23 LAB — BASIC METABOLIC PANEL
Anion gap: 6 (ref 5–15)
BUN: 14 mg/dL (ref 6–20)
CO2: 23 mmol/L (ref 22–32)
Calcium: 9 mg/dL (ref 8.9–10.3)
Chloride: 107 mmol/L (ref 98–111)
Creatinine, Ser: 1.25 mg/dL — ABNORMAL HIGH (ref 0.61–1.24)
GFR, Estimated: >60 mL/min (ref >60)
Glucose, Bld: 112 mg/dL — ABNORMAL HIGH (ref 70–99)
Potassium: 3.5 mmol/L (ref 3.5–5.1)
Sodium: 136 mmol/L (ref 135–145)

## 2021-02-23 NOTE — Progress Notes (Addendum)
PCP - Mr. Saari does not remember his name -was seen at [facility] at [place]. I requested records. Patient will continue seeing this MD.  Cardiologist - none  EP-none  Endocrine-none  Pulm-none  Chest x-ray - na  EKG - 12/24/20  Stress Test - never  ECHO - never  Cardiac Cath - never  AICD-no PM-no LOOP-no  Dialysis-no  Sleep Study - no CPAP - no  LABS-BMP  ASA-no  ERAS-yes  HA1C-na Fasting Blood Sugar - na Checks Blood Sugar _0____ times a day  Anesthesia- Mr. Hogston was scheduled for this surgery 12/04/20, it was cancelled due to elevated blood pressure. Patient did not have a PCP, he was seen at [facility] at [place] and started on antihypertensive medications. Blood pressure today was 150/93, he has taken blood pressure medications today. Mr. Corsino has a blood pressure machine at home, I instructed Mr. Wayment to check frequently to see if it is lower than it was today. I also instructed patient to limit salt and to drink lots of water in this week before surgery. Mr. Coutts had Nasal Septum surgery at the surgical Center on 12/25/20  Pt denies having chest pain, sob, or fever at this time. All instructions explained to the pt, with a verbal understanding of the material. Pt agrees to go over the instructions while at home for a better understanding. Patient denies having any s/s of Covid in her household.  Patient denies any known exposure to Covid. The opportunity to ask questions was provided.

## 2021-02-23 NOTE — Pre-Procedure Instructions (Signed)
Joshua Farley  02/23/2021     Your procedure is scheduled on Tuesday, August 2nd.  Report to [facility] Admitting at 1:00 PM   Call this number if you have problems the morning of surgery: [phone] this is the Pre- OP desk.                Remember:  Do not eat after midnight Monday, August 1.   You may drink clear liquids until  12:00 noon.  Clear liquids allowed are:                     Water, Juice (non-citric and without pulp - diabetics please choose diet or no sugar options), Carbonated beverages - (diabetics please choose diet or no sugar options), Clear Tea, Black Coffee only (no creamer, milk or cream including half and half), Plain Jell-O only (diabetics please choose diet or no sugar options), Gatorade (diabetics please choose diet or no sugar options), and Plain Popsicles only     Take these medicines the morning of surgery with A SIP OF WATER: amLODipine (NORVASC) pantoprazole (PROTONIX)   Enhanced Recovery after Surgery for Orthopedics Enhanced Recovery after Surgery is a protocol used to improve the stress on your body and your recovery after surgery.  Patient Instructions  Drink ONE (1) Pre-Surgery Clear Ensure by 12:00 PM the morning of surgery   This drink was given to you during your hospital  pre-op appointment visit. Nothing else to drink after completing the  Pre-Surgery Clear Ensure.         If you have questions, please contact your surgeon's office.    Special Instructions:  If you smoke, do not smoke within 24 hours of surgery.   [place]- Preparing For Surgery  Before surgery, you can play an important role. Because skin is not sterile, your skin needs to be as free of germs as possible. You can reduce the number of germs on your skin by washing with CHG (chlorahexidine gluconate) Soap before surgery.  CHG is an antiseptic cleaner which kills germs and bonds with the skin to continue killing germs even after washing.    Oral  Hygiene is also important to reduce your risk of infection.  Remember - BRUSH YOUR TEETH THE MORNING OF SURGERY WITH YOUR REGULAR TOOTHPASTE  Please do not use if you have an allergy to CHG or antibacterial soaps. If your skin becomes reddened/irritated stop using the CHG.  Do not shave (including legs and underarms) for at least 48 hours prior to first CHG shower. It is OK to shave your face.  Please follow these instructions carefully.   Shower the NIGHT BEFORE SURGERY and the MORNING OF SURGERY with CHG.   If you chose to wash your hair, wash your hair first as usual with your normal shampoo.  After you shampoo, rinse your hair and body thoroughly to remove the shampoo,Wash Face and genitals (private parts)  with your normal soap.  Use CHG as you would any other liquid soap. You can apply CHG directly to the skin and wash gently with a scrungie or a clean washcloth.   Apply the CHG Soap to your body ONLY FROM THE NECK DOWN.  Do not use on open wounds or open sores. Avoid contact with your eyes, ears, mouth and genitals (private parts).   Wash thoroughly, paying special attention to the area where your surgery will be performed.  Thoroughly rinse your body with warm water from  the neck down.  DO NOT shower/wash with your normal soap after using and rinsing off the CHG Soap.  Pat yourself dry with a CLEAN TOWEL.  Wear CLEAN PAJAMAS to bed the night before surgery, wear comfortable clothes the morning of surgery  Place CLEAN SHEETS on your bed the night of your first shower and DO NOT SLEEP WITH PETS.  Day of Surgery: Shower as instructed above.  Do not wear lotions, powders, or perfumes, or deodorant. Please wear clean clothes to the hospital/surgery center.   Remember to brush your teeth WITH YOUR REGULAR TOOTHPASTE.   Men may shave face and neck.  Do not bring valuables to the hospital.  [facility] is not responsible for any belongings or valuables.  Contacts, dentures or  bridgework may not be worn into surgery.  Leave your suitcase in the car.  After surgery it may be brought to your room.  For patients admitted to the hospital, discharge time will be determined by your treatment team.  Patients discharged the day of surgery will not be allowed to drive home.     Please read over the following fact sheets that you were given. Pain Booklet and Surgical Site Infection Prevention and Incentive Spirometry

## 2021-02-24 NOTE — Anesthesia Preprocedure Evaluation (Addendum)
Anesthesia Evaluation  Patient identified by MRN, date of birth, ID band Patient awake    Reviewed: Allergy & Precautions, NPO status , Patient's Chart, lab work & pertinent test results  History of Anesthesia Complications Negative for: history of anesthetic complications  Airway Mallampati: I  TM Distance: >3 FB Neck ROM: Full    Dental  (+) Dental Advisory Given   Pulmonary Current Smoker and Patient abstained from smoking.,    breath sounds clear to auscultation       Cardiovascular hypertension, Pt. on medications (-) angina Rhythm:Regular Rate:Normal     Neuro/Psych negative neurological ROS     GI/Hepatic Neg liver ROS, GERD  Medicated and Controlled,  Endo/Other  negative endocrine ROS  Renal/GU Renal InsufficiencyRenal disease     Musculoskeletal   Abdominal   Peds  Hematology negative hematology ROS (+)   Anesthesia Other Findings   Reproductive/Obstetrics                           Anesthesia Physical Anesthesia Plan  ASA: 2  Anesthesia Plan: General   Post-op Pain Management: GA combined w/ Regional for post-op pain   Induction: Intravenous  PONV Risk Score and Plan: 1 and Ondansetron and Dexamethasone  Airway Management Planned: Oral ETT  Additional Equipment: None  Intra-op Plan:   Post-operative Plan: Extubation in OR  Informed Consent: I have reviewed the patients History and Physical, chart, labs and discussed the procedure including the risks, benefits and alternatives for the proposed anesthesia with the patient or authorized representative who has indicated his/her understanding and acceptance.     Dental advisory given  Plan Discussed with: CRNA and Surgeon  Anesthesia Plan Comments: (PAT note written 02/24/2021 by Shonna Chock, PA-C. Plan routine monitors, GETA with ISB for post op analgesia)      Anesthesia Quick Evaluation

## 2021-02-24 NOTE — Progress Notes (Signed)
Anesthesia Chart Review:  Case: [id number] Date/Time: 03/02/21 1445   Procedure: Left shoulder scope, Rotator cuff repair,biceps tenodesis and subacromial decompression (Left) -   Anesthesia type: Choice   Pre-op diagnosis: Left Shoulder rotator cuff tear   [facility] ROOM 07 / [facility]   Surgeons: Yolonda Kida, MD       DISCUSSION: Patient is a 50 year old male scheduled for the above procedure.  Surgery was initially planned on 12/04/2020, however he presented with BP readings 160-165/103-120. Surgery was canceled and he was advised to get established with a PCP and restart medications.  History includes smoking, HTN, CKD, GERD. S/p nasal septoplasty and bilateral partial inferior turbinate resection on 12/25/2020.  He received medical clearance from [facility] [place] on 01/15/2021 after follow-up for hypertension.  Blood pressure 126/81 with home blood pressures 130s/80s.  He wrote "patient is now cleared for orthopedic surgery on his left shoulder..."  Anesthesia team to evaluate on the day of surgery.  VS: BP (!) 150/93   Pulse 64   Temp 36.8 C (Oral)   Resp 17   Ht 5\' 7"  (1.702 m)   Wt 95.3 kg   SpO2 96%   BMI 32.91 kg/m   PROVIDERS: , PA is PCP [facility]).  Last visit 01/15/2021 for follow-up hypertension.  Home blood pressure readings 130s/80s. Amlodipine and HCTZ continued at current doses.  01/17/2021, MD is ENT   LABS: Preoperative labs/BMET noted.  He had a CBC with differential on 12/23/2020 showing WBC 6.1, hemoglobin 15.7, hematocrit 46.4, platelet count 385,.  LFTs were normal on 12/18/2020. (all labs ordered are listed, but only abnormal results are displayed)  Labs Reviewed  BASIC METABOLIC PANEL - Abnormal; Notable for the following components:      Result Value   Glucose, Bld 112 (*)    Creatinine, Ser 1.25 (*)    All other components within normal limits     IMAGES:   EKG: 12/24/20: Normal sinus  rhythm Nonspecific T wave abnormality Abnormal ECG Compared to previous tracing , rate is slower Confirmed by 12/26/20, Manish ([id number]) on 12/24/2020 2:08:02 PM   CV: N/A   Past Medical History:  Diagnosis Date   Chronic kidney disease    Creatine elevated since 2016   Complete rotator cuff tear of left shoulder    GERD (gastroesophageal reflux disease)    History of hypertension    none in 5 years as of 11-27-2020   Hypertension    02/23/21 - present   Reflux     Past Surgical History:  Procedure Laterality Date   LACERATION REPAIR Right 12/28/2019   Procedure: REPAIR FOREARM LACERATION;  Surgeon: 12/30/2019, MD;  Location: [facility] OR;  Service: Orthopedics;  Laterality: Right;   NASAL SEPTOPLASTY W/ TURBINOPLASTY Bilateral 12/25/2020   Procedure: NASAL SEPTOPLASTY WITH BILATERAL TURBINATE REDUCTION;  Surgeon: 12/27/2020, MD;  Location: [facility];  Service: ENT;  Laterality: Bilateral;   NO PAST SURGERIES     OPEN REDUCTION INTERNAL FIXATION (ORIF) PROXIMAL PHALANX Left 11/12/2014   Procedure: OPEN REDUCTION INTERNAL FIXATION (ORIF) LEFT LONG PROXIMAL PHALANX FRACTURE ;  Surgeon: 11/14/2014, MD;  Location: [facility];  Service: Orthopedics;  Laterality: Left;    MEDICATIONS:  amLODipine (NORVASC) 10 MG tablet   hydrochlorothiazide (HYDRODIURIL) 25 MG tablet   pantoprazole (PROTONIX) 40 MG tablet   No current facility-administered medications for this encounter.    Dairl Ponder, PA-C Surgical Short Stay/Anesthesiology  [facility] Phone [phone] [facility] Phone [phone] 02/24/2021 4:27 PM

## 2021-03-02 ENCOUNTER — Encounter (HOSPITAL_COMMUNITY): Payer: Self-pay | Admitting: Orthopedic Surgery

## 2021-03-02 ENCOUNTER — Ambulatory Visit (HOSPITAL_COMMUNITY)
Admission: RE | Admit: 2021-03-02 | Discharge: 2021-03-02 | Disposition: A | Discharge status: Home / Self Care | Payer: 59 | Attending: Orthopedic Surgery | Admitting: Orthopedic Surgery

## 2021-03-02 ENCOUNTER — Encounter (HOSPITAL_COMMUNITY)
Admission: RE | Disposition: A | Discharge status: Home / Self Care | Payer: Self-pay | Source: Home / Self Care | Attending: Orthopedic Surgery

## 2021-03-02 ENCOUNTER — Ambulatory Visit (HOSPITAL_COMMUNITY): Payer: 59 | Admitting: Vascular Surgery

## 2021-03-02 ENCOUNTER — Ambulatory Visit (HOSPITAL_COMMUNITY): Payer: 59 | Admitting: Anesthesiology

## 2021-03-02 DIAGNOSIS — N189 Chronic kidney disease, unspecified: Secondary | ICD-10-CM | POA: Diagnosis not present

## 2021-03-02 DIAGNOSIS — I129 Hypertensive chronic kidney disease with stage 1 through stage 4 chronic kidney disease, or unspecified chronic kidney disease: Secondary | ICD-10-CM | POA: Insufficient documentation

## 2021-03-02 DIAGNOSIS — M25812 Other specified joint disorders, left shoulder: Secondary | ICD-10-CM | POA: Insufficient documentation

## 2021-03-02 DIAGNOSIS — M75122 Complete rotator cuff tear or rupture of left shoulder, not specified as traumatic: Secondary | ICD-10-CM | POA: Diagnosis not present

## 2021-03-02 DIAGNOSIS — Z79899 Other long term (current) drug therapy: Secondary | ICD-10-CM | POA: Diagnosis not present

## 2021-03-02 DIAGNOSIS — F17210 Nicotine dependence, cigarettes, uncomplicated: Secondary | ICD-10-CM | POA: Insufficient documentation

## 2021-03-02 DIAGNOSIS — Z888 Allergy status to other drugs, medicaments and biological substances status: Secondary | ICD-10-CM | POA: Diagnosis not present

## 2021-03-02 HISTORY — PX: SHOULDER ARTHROSCOPY WITH ROTATOR CUFF REPAIR: SHX5685

## 2021-03-02 SURGERY — ARTHROSCOPY, SHOULDER, WITH ROTATOR CUFF REPAIR
Anesthesia: General | Site: Shoulder | Laterality: Left

## 2021-03-02 MED ORDER — FENTANYL CITRATE (PF) 100 MCG/2ML IJ SOLN: 100.0000 ug | Freq: Once | INTRAMUSCULAR | Status: AC

## 2021-03-02 MED ORDER — ACETAMINOPHEN 500 MG PO TABS: 1000.0000 mg | ORAL_TABLET | Freq: Once | ORAL | Status: AC

## 2021-03-02 MED ORDER — MIDAZOLAM HCL 2 MG/2ML IJ SOLN
INTRAMUSCULAR | Status: AC
  Administered 2021-03-02: 2 mg via INTRAVENOUS
  Filled 2021-03-02: qty 2

## 2021-03-02 MED ORDER — OXYCODONE HCL 5 MG PO TABS: 5.0000 mg | ORAL_TABLET | Freq: Three times a day (TID) | ORAL | 0 refills | Status: AC | PRN

## 2021-03-02 MED ORDER — PROMETHAZINE HCL 25 MG/ML IJ SOLN
INTRAMUSCULAR | Status: AC
  Filled 2021-03-02: qty 1

## 2021-03-02 MED ORDER — BUPIVACAINE-EPINEPHRINE (PF) 0.5% -1:200000 IJ SOLN
INTRAMUSCULAR | Status: DC | PRN
  Administered 2021-03-02: 10 mL via PERINEURAL

## 2021-03-02 MED ORDER — FENTANYL CITRATE (PF) 100 MCG/2ML IJ SOLN
INTRAMUSCULAR | Status: DC | PRN
  Administered 2021-03-02: 100 ug via INTRAVENOUS

## 2021-03-02 MED ORDER — EPINEPHRINE PF 1 MG/ML IJ SOLN
INTRAMUSCULAR | Status: AC
  Filled 2021-03-02: qty 2

## 2021-03-02 MED ORDER — OXYCODONE HCL 5 MG PO TABS: 5.0000 mg | ORAL_TABLET | Freq: Once | ORAL | Status: DC | PRN

## 2021-03-02 MED ORDER — EPINEPHRINE PF 1 MG/ML IJ SOLN
INTRAMUSCULAR | Status: DC | PRN
  Administered 2021-03-02: 1 mg

## 2021-03-02 MED ORDER — PROMETHAZINE HCL 25 MG/ML IJ SOLN
6.2500 mg | INTRAMUSCULAR | Status: DC | PRN
  Administered 2021-03-02: 6.25 mg via INTRAVENOUS

## 2021-03-02 MED ORDER — ACETAMINOPHEN 500 MG PO TABS
ORAL_TABLET | ORAL | Status: AC
  Administered 2021-03-02: 1000 mg via ORAL
  Filled 2021-03-02: qty 2

## 2021-03-02 MED ORDER — CEFAZOLIN SODIUM-DEXTROSE 2-4 GM/100ML-% IV SOLN
2.0000 g | INTRAVENOUS | Status: AC
  Administered 2021-03-02: 2 g via INTRAVENOUS

## 2021-03-02 MED ORDER — SUGAMMADEX SODIUM 200 MG/2ML IV SOLN
INTRAVENOUS | Status: DC | PRN
  Administered 2021-03-02: 400 mg via INTRAVENOUS

## 2021-03-02 MED ORDER — CHLORHEXIDINE GLUCONATE 0.12 % MT SOLN: 15.0000 mL | Freq: Once | OROMUCOSAL | Status: AC

## 2021-03-02 MED ORDER — LACTATED RINGERS IV SOLN: INTRAVENOUS | Status: DC | PRN

## 2021-03-02 MED ORDER — DEXAMETHASONE SODIUM PHOSPHATE 10 MG/ML IJ SOLN
INTRAMUSCULAR | Status: DC | PRN
  Administered 2021-03-02: 5 mg via INTRAVENOUS

## 2021-03-02 MED ORDER — CEFAZOLIN SODIUM-DEXTROSE 2-4 GM/100ML-% IV SOLN
INTRAVENOUS | Status: AC
  Filled 2021-03-02: qty 100

## 2021-03-02 MED ORDER — PROPOFOL 10 MG/ML IV BOLUS
INTRAVENOUS | Status: DC | PRN
  Administered 2021-03-02: 200 mg via INTRAVENOUS

## 2021-03-02 MED ORDER — ROCURONIUM BROMIDE 100 MG/10ML IV SOLN
INTRAVENOUS | Status: DC | PRN
  Administered 2021-03-02: 80 mg via INTRAVENOUS

## 2021-03-02 MED ORDER — CHLORHEXIDINE GLUCONATE 0.12 % MT SOLN
OROMUCOSAL | Status: AC
  Administered 2021-03-02: 15 mL via OROMUCOSAL
  Filled 2021-03-02: qty 15

## 2021-03-02 MED ORDER — ORAL CARE MOUTH RINSE: 15.0000 mL | Freq: Once | OROMUCOSAL | Status: AC

## 2021-03-02 MED ORDER — METHOCARBAMOL 500 MG PO TABS: 500.0000 mg | ORAL_TABLET | Freq: Four times a day (QID) | ORAL | 1 refills | Status: DC | PRN

## 2021-03-02 MED ORDER — SODIUM CHLORIDE 0.9 % IR SOLN
Status: DC | PRN
  Administered 2021-03-02 (×2): 6000 mL

## 2021-03-02 MED ORDER — FENTANYL CITRATE (PF) 250 MCG/5ML IJ SOLN
INTRAMUSCULAR | Status: AC
  Filled 2021-03-02: qty 5

## 2021-03-02 MED ORDER — FENTANYL CITRATE (PF) 100 MCG/2ML IJ SOLN
50.0000 ug | Freq: Once | INTRAMUSCULAR | Status: DC
Start: 2021-03-02 — End: 2021-03-02

## 2021-03-02 MED ORDER — PROPOFOL 10 MG/ML IV BOLUS
INTRAVENOUS | Status: AC
  Filled 2021-03-02: qty 20

## 2021-03-02 MED ORDER — MIDAZOLAM HCL 2 MG/2ML IJ SOLN
INTRAMUSCULAR | Status: AC
  Filled 2021-03-02: qty 2

## 2021-03-02 MED ORDER — FENTANYL CITRATE (PF) 100 MCG/2ML IJ SOLN
INTRAMUSCULAR | Status: AC
  Administered 2021-03-02: 100 ug via INTRAVENOUS
  Filled 2021-03-02: qty 2

## 2021-03-02 MED ORDER — BUPIVACAINE LIPOSOME 1.3 % IJ SUSP
INTRAMUSCULAR | Status: DC | PRN
  Administered 2021-03-02: 10 mL via PERINEURAL

## 2021-03-02 MED ORDER — MIDAZOLAM HCL 2 MG/2ML IJ SOLN: 2.0000 mg | Freq: Once | INTRAMUSCULAR | Status: AC

## 2021-03-02 MED ORDER — ONDANSETRON 4 MG PO TBDP: 4.0000 mg | ORAL_TABLET | Freq: Three times a day (TID) | ORAL | 0 refills | Status: DC | PRN

## 2021-03-02 MED ORDER — HYDROMORPHONE HCL 1 MG/ML IJ SOLN: 0.2500 mg | INTRAMUSCULAR | Status: DC | PRN

## 2021-03-02 MED ORDER — MIDAZOLAM HCL 2 MG/2ML IJ SOLN: 0.5000 mg | Freq: Once | INTRAMUSCULAR | Status: DC | PRN

## 2021-03-02 MED ORDER — MEPERIDINE HCL 25 MG/ML IJ SOLN: 6.2500 mg | INTRAMUSCULAR | Status: DC | PRN

## 2021-03-02 MED ORDER — OXYCODONE HCL 5 MG/5ML PO SOLN: 5.0000 mg | Freq: Once | ORAL | Status: DC | PRN

## 2021-03-02 MED ORDER — LACTATED RINGERS IV SOLN: INTRAVENOUS | Status: DC

## 2021-03-02 MED ORDER — LIDOCAINE HCL (CARDIAC) PF 100 MG/5ML IV SOSY
PREFILLED_SYRINGE | INTRAVENOUS | Status: DC | PRN
  Administered 2021-03-02: 40 mg via INTRAVENOUS

## 2021-03-02 SURGICAL SUPPLY — 38 items
ANCHOR SWIVELOCK BIO 4.75X19.1 (Anchor) ×2 IMPLANT
BAG COUNTER SPONGE SURGICOUNT (BAG) ×2 IMPLANT
BLADE SURG 11 STRL SS (BLADE) ×2 IMPLANT
BURR OVAL 8 FLU 4.0X13 (MISCELLANEOUS) ×2 IMPLANT
CANNULA TWIST IN 8.25X7CM (CANNULA) ×2 IMPLANT
CLSR STERI-STRIP ANTIMIC 1/2X4 (GAUZE/BANDAGES/DRESSINGS) ×2 IMPLANT
DRAPE STERI 35X30 U-POUCH (DRAPES) ×2 IMPLANT
DRAPE U-SHAPE 47X51 STRL (DRAPES) ×2 IMPLANT
DRSG PAD ABDOMINAL 8X10 ST (GAUZE/BANDAGES/DRESSINGS) ×6 IMPLANT
DURAPREP 26ML APPLICATOR (WOUND CARE) ×2 IMPLANT
GAUZE SPONGE 4X4 12PLY STRL (GAUZE/BANDAGES/DRESSINGS) ×2 IMPLANT
GLOVE SRG 8 PF TXTR STRL LF DI (GLOVE) ×1 IMPLANT
GLOVE SURG ENC MOIS LTX SZ7.5 (GLOVE) ×4 IMPLANT
GLOVE SURG UNDER POLY LF SZ8 (GLOVE) ×1
GOWN STRL REUS W/ TWL LRG LVL3 (GOWN DISPOSABLE) ×1 IMPLANT
GOWN STRL REUS W/TWL LRG LVL3 (GOWN DISPOSABLE) ×1
GOWN STRL REUS W/TWL XL LVL3 (GOWN DISPOSABLE) ×4 IMPLANT
KIT BASIN OR (CUSTOM PROCEDURE TRAY) ×2 IMPLANT
KIT TURNOVER KIT B (KITS) ×2 IMPLANT
MANIFOLD NEPTUNE II (INSTRUMENTS) ×2 IMPLANT
NEEDLE SCORPION MULTI FIRE (NEEDLE) ×2 IMPLANT
NEEDLE SPNL 18GX3.5 QUINCKE PK (NEEDLE) ×2 IMPLANT
PACK SHOULDER (CUSTOM PROCEDURE TRAY) ×2 IMPLANT
PAD ARMBOARD 7.5X6 YLW CONV (MISCELLANEOUS) ×6 IMPLANT
PORT APPOLLO RF 90DEGREE MULTI (SURGICAL WAND) ×2 IMPLANT
PROBE BIPOLAR ATHRO 135MM 90D (MISCELLANEOUS) IMPLANT
SLEEVE ARM SUSPENSION SYSTEM (MISCELLANEOUS) ×2 IMPLANT
SLING ULTRA II LG (MISCELLANEOUS) ×2 IMPLANT
SPONGE T-LAP 4X18 ~~LOC~~+RFID (SPONGE) ×2 IMPLANT
SUT ETHILON 3 0 PS 1 (SUTURE) ×2 IMPLANT
SUT MNCRL AB 3-0 PS2 27 (SUTURE) ×2 IMPLANT
SUT TIGER TAPE 7 IN WHITE (SUTURE) IMPLANT
SYSTEM TENODESIS BC LNT 3.9 (Orthopedic Implant) ×2 IMPLANT
TAPE FIBER 2MM 7IN #2 BLUE (SUTURE) IMPLANT
TAPE PAPER 3X10 WHT MICROPORE (GAUZE/BANDAGES/DRESSINGS) ×2 IMPLANT
TOWEL GREEN STERILE (TOWEL DISPOSABLE) ×2 IMPLANT
TOWEL GREEN STERILE FF (TOWEL DISPOSABLE) ×2 IMPLANT
TUBING ARTHROSCOPY IRRIG 16FT (MISCELLANEOUS) ×2 IMPLANT

## 2021-03-02 NOTE — Social Work (Signed)
CSW consulted to provide Pt with resources for applying for disability. CSW met with Pt at bedside and provided printed information about [facility] and how to contact them for disability claims.  Pt verbalized thanks and reported no further social work needs at this time.  Vergie Living MSW LCSWA Transitions of Care  Clinical Social Worker  [facility] Departments  [phone]

## 2021-03-02 NOTE — Op Note (Signed)
03/02/2021   PATIENT:  Joshua Farley    PRE-OPERATIVE DIAGNOSIS:   1.  Left shoulder rotator cuff tear, acute and complete. 2.  Left shoulder proximal biceps tendinitis 3.  Left shoulder degenerative labral fraying. 4.  Left shoulder subacromial impingement    POST-OPERATIVE DIAGNOSIS:  same   PROCEDURE:  Procedure(s) with comments: 1.  Right shoulder arthroscopy with rotator cuff repair of supraspinatus complete tear. 2.  Right shoulder arthroscopy with extensive debridement of rotator interval, anterior labrum, superior labrum, and posterior labrum 3.  Right shoulder arthroscopic subacromial decompression with coracoacromial ligament release 4.  Right shoulder arthroscopic biceps tenodesis with suprapectoral technique  SURGEON:  Yolonda Kida, MD  PHYSICIAN ASSISTANT: Dion Saucier, PA-C  Assistant attestation: PA Sharon Seller was present for the entire procedure and participated in all portions.  ANESTHESIA:   General with interscalene  ESTIMATED BLOOD LOSS: 10 cc  PREOPERATIVE INDICATIONS:  [name] is a  50 y.o. male with a diagnosis of Left Shoulder rotator cuff tear who failed conservative measures and elected for surgical management.    The risks benefits and alternatives were discussed with the patient preoperatively including but not limited to the risks of infection, bleeding, nerve injury, cardiopulmonary complications, the need for revision surgery, among others, and the patient was willing to proceed.  OPERATIVE IMPLANTS:  Arthrex 3.9 mm swivel lock anchor for biceps tenodesis in the intertubercular groove. Arthrex fiber tape suture for knotless repair Arthrex 4.75 mm knotless swivel lock anchor for rotator cuff repair  OPERATIVE FINDINGS: On the diagnostic arthroscopy we identified in the intra-articular portion degenerative labral tearing of the superior and anterior aspect of the labrum.  We also identified posterior labral tearing.  We identified  proximal biceps tendinitis adjacent to the biceps anchor.  Subscapularis tendon was intact, infraspinatus tendon was intact, teres minor was intact.  Supraspinatus had a small anterior crescent shaped tear with minimal retraction but in a deep crescent morphology.  No glenohumeral arthritis no loose bodies in the joint.  In the subacromial space we identified the crescent-shaped supraspinatus tear as identified above with abundant subacromial bursitis.  Tear measured 1.5 cm from medial to lateral and 1 cm from anterior to posterior.  Otherwise there was moderate sclerosis and abrasion of the CA ligament.  There was a type II acromion.   OPERATIVE PROCEDURE: The patient was brought to the operating room and placed in the supine position. General anesthesia was administered. IV antibiotics were given. General anesthesia was administered.   Examination under anesthesia demonstrated that there was no gross instability and range of motion was full and symmetric without blocks.  We began the procedure in the intra-articular compartment.  We established a posterior lateral viewing portal 2 cm inferior and 1 cm medial to the posterior lateral corner of the acromion.  We entered the joint and then established a mid glenoid working portal.  The above findings were identified on the intra-articular portion.  Next we began with the arthroscopic biceps tenodesis.  We used a loop intact method.  A fiber link suture was passed around the biceps tendon and then pierced in a locking fashion with a suture passer.  We then tenotomized from the superior labrum.  We then placed a pilot hole adjacent to the upper border the subscapularis and the intra-articular joint.  We then loaded the suture limbs into the 3.9 mm Arthrex anchor.  This was inserted at the proximalmost aspect of the intertubercular groove.  This had excellent tension and  good purchase.  We then moved our attention to the extensive debridement.  And  arthroscopic fashion we extensively debrided the rotator interval with motorized shaver, anterior labrum, supra labrum, and posterior labrum.  Next, we moved to the subacromial space.  We established a lateral working portal for centimeters distant from the midportion of the [facility] joint off the lateral tip of the acromion.  We then moved our viewing portal to a posterior lateral position just in a more anterior fashion such that we could view better.  A wide bursectomy was carried out with motorized shaver.  We then moved ahead with the arthroscopic rotator cuff repair.  Bursal sided releases were performed with commendation motorized shaver and RF wand.  We identified a crescent-shaped tear.  This was the supraspinatus only.  Anterior aspect.  This measured 1.5 cm medial to lateral 1.0 cm anterior to posterior.  We then passed in a horizontal mattress fashion a fiber tape with a scorpion suture passer.  These 2 tails were loaded into a 4.75 mm swivel lock anchor.  This was placed into the lateral row after biologically preparing the greater tuberosity.  Had excellent purchase and moved as a single unit after the repair.  Lastly, we moved ahead with the arthroscopic subacromial decompression.  While viewing from lateral portal or posterior portal utilized a cutting block technique with the motorized bur to flatten the type II morphology of the acromion.  After the CA ligament was reflected off of the anterior lateral corner we were able to nicely flatten this into a type I morphology.  the arthroscopic cannulas were removed, and the portals closed with Monocryl followed by Steri-Strips and sterile gauze. The patient was awakened and returned to the PACU in stable and satisfactory condition. There were no complications and the patient tolerated the procedure well.  All counts were correct.  Disposition:  The patient will be nonweightbearing with an abduction sling to the operative extremity.  He may begin  scapular retractions and elbow hand and wrist range motion as tolerated.  He will begin physical therapy in 1 week.  I will see them back in the office in 2 weeks for a wound check.

## 2021-03-02 NOTE — Progress Notes (Signed)
Orthopedic Tech Progress Note Patient Details:  Joshua Farley 1971-01-30 [id number]  OR RN called requesting a SHOULDER ABDUCTION PILLOW. Dropped off when in OROrtho Devices Type of Ortho Device: Shoulder abduction pillow Ortho Device/Splint Interventions: Other (comment)   Post Interventions Patient Tolerated: Other (comment) Instructions Provided: Other (comment)  Donald Pore 03/02/2021, 5:27 PM

## 2021-03-02 NOTE — H&P (Signed)
ORTHOPAEDIC H and P  REQUESTING PHYSICIAN: Yolonda Kida, MD  PCP:  Pcp, No  Chief Complaint: Left shoulder pain  HPI: Joshua Farley is a 50 y.o. male who complains of worsening left shoulder pain and weakness.  He has had symptoms for couple years now.  He has failed pretty exhaustive conservative treatment.  He is here today for left shoulder arthroscopy with rotator cuff repair and biceps tenodesis and subacromial decompression.  Past Medical History:  Diagnosis Date   Chronic kidney disease    Creatine elevated since 2016   Complete rotator cuff tear of left shoulder    GERD (gastroesophageal reflux disease)    History of hypertension    none in 5 years as of 11-27-2020   Hypertension    02/23/21 - present   Reflux    Past Surgical History:  Procedure Laterality Date   LACERATION REPAIR Right 12/28/2019   Procedure: REPAIR FOREARM LACERATION;  Surgeon: Mack Hook, MD;  Location: [facility] OR;  Service: Orthopedics;  Laterality: Right;   NASAL SEPTOPLASTY W/ TURBINOPLASTY Bilateral 12/25/2020   Procedure: NASAL SEPTOPLASTY WITH BILATERAL TURBINATE REDUCTION;  Surgeon: Newman Pies, MD;  Location: [facility];  Service: ENT;  Laterality: Bilateral;   NO PAST SURGERIES     OPEN REDUCTION INTERNAL FIXATION (ORIF) PROXIMAL PHALANX Left 11/12/2014   Procedure: OPEN REDUCTION INTERNAL FIXATION (ORIF) LEFT LONG PROXIMAL PHALANX FRACTURE ;  Surgeon: Dairl Ponder, MD;  Location: [facility];  Service: Orthopedics;  Laterality: Left;   Social History   Socioeconomic History   Marital status: Single    Spouse name: Not on file   Number of children: Not on file   Years of education: Not on file   Highest education level: Not on file  Occupational History   Not on file  Tobacco Use   Smoking status: Every Day    Packs/day: 0.50    Years: 20.00    Pack years: 10.00    Types: Cigarettes   Smokeless tobacco: Never  Vaping Use   Vaping Use: Never  used  Substance and Sexual Activity   Alcohol use: Not Currently   Drug use: No   Sexual activity: Not on file  Other Topics Concern   Not on file  Social History Narrative   Not on file   Social Determinants of Health   Financial Resource Strain: Not on file  Food Insecurity: Not on file  Transportation Needs: Not on file  Physical Activity: Not on file  Stress: Not on file  Social Connections: Not on file   History reviewed. No pertinent family history. Allergies  Allergen Reactions   Lisinopril Itching   Prior to Admission medications   Medication Sig Start Date End Date Taking? Authorizing Provider  amLODipine (NORVASC) 10 MG tablet Take 10 mg by mouth daily.   Yes [provider]  hydrochlorothiazide (HYDRODIURIL) 25 MG tablet Take 25 mg by mouth daily.   Yes [provider]  pantoprazole (PROTONIX) 40 MG tablet Take 40 mg by mouth 2 (two) times daily. 01/22/21  Yes [provider]   No results found.  Positive ROS: All other systems have been reviewed and were otherwise negative with the exception of those mentioned in the HPI and as above.  Physical Exam: General: Alert, no acute distress Cardiovascular: No pedal edema Respiratory: No cyanosis, no use of accessory musculature GI: No organomegaly, abdomen is soft and non-tender Skin: No lesions in the area of chief complaint Neurologic:  Sensation intact distally Psychiatric: Patient is competent for consent with normal mood and affect Lymphatic: No axillary or cervical lymphadenopathy  MUSCULOSKELETAL: Left upper extremity is warm and well-perfused.  No lesions or open wounds.  He is neurovascularly intact.  Assessment: 1.  Left shoulder rotator cuff tear, acute and complete.  2.  Left shoulder proximal biceps tendinitis  3.  Left shoulder degenerative labral fraying.  4.  Left shoulder subacromial impingement  Plan: -Plan to proceed today with arthroscopic management of the  above.  We again discussed that he is indicated for this based on failure of response with reasonable conservative care.  We discussed the risk of bleeding, infection, damage to surrounding nerves and vessels, stiffness, failure of pain relief, failure of repair, need for revision surgery, risk of anesthesia, and the risk of DVT.  He has provided informed consent.  -We will plan for discharge home postoperatively from PACU.    Yolonda Kida, MD Cell [phone]    03/02/2021 2:47 PM

## 2021-03-02 NOTE — Anesthesia Procedure Notes (Signed)
Anesthesia Regional Block: Interscalene brachial plexus block   Pre-Anesthetic Checklist: , timeout performed,  Correct Patient, Correct Site, Correct Laterality,  Correct Procedure, Correct Position, site marked,  Risks and benefits discussed,  Surgical consent,  Pre-op evaluation,  At surgeon's request and post-op pain management  Laterality: Left and Upper  Prep: chloraprep       Needles:  Injection technique: Single-shot  Needle Type: Echogenic Needle     Needle Length: 9cm  Needle Gauge: 21     Additional Needles:   Procedures:,,,, ultrasound used (permanent image in chart),,    Narrative:  Start time: 03/02/2021 2:37 PM End time: 03/02/2021 2:43 PM Injection made incrementally with aspirations every 5 mL.  Performed by: Personally  Anesthesiologist: Jairo Ben, MD  Additional Notes: Pt identified in Holding room.  Monitors applied. Working IV access confirmed. Sterile prep L clavicle and neck.  #21ga ECHOgenic Arrow block needle to interscalene brachial plexus with US guidance.  10cc 0.5% Bupivacaine with 1:200k epi, Exparel injected incrementally after negative test dose.  Patient asymptomatic, VSS, no heme aspirated, tolerated well.   Sandford Craze, MD

## 2021-03-02 NOTE — Progress Notes (Signed)
Patient verbalized need for assistance setting up disability d/t being out of work for several years. Consult ordered for social work placed. Dr. Jean Rosenthal anesthesia aware. Social worker notified at this time.

## 2021-03-02 NOTE — Anesthesia Procedure Notes (Signed)
Procedure Name: Intubation Date/Time: 03/02/2021 3:31 PM Performed by: Jonna Munro, CRNA Pre-anesthesia Checklist: Patient identified, Emergency Drugs available, Suction available, Patient being monitored and Timeout performed Patient Re-evaluated:Patient Re-evaluated prior to induction Oxygen Delivery Method: Circle system utilized Preoxygenation: Pre-oxygenation with 100% oxygen Induction Type: IV induction Ventilation: Oral airway inserted - appropriate to patient size Laryngoscope Size: Mac and 4 Grade View: Grade II Tube type: Oral Tube size: 7.5 mm Number of attempts: 1 Airway Equipment and Method: Stylet Placement Confirmation: ETT inserted through vocal cords under direct vision, breath sounds checked- equal and bilateral and positive ETCO2 Secured at: 23 cm Tube secured with: Tape Dental Injury: Teeth and Oropharynx as per pre-operative assessment

## 2021-03-02 NOTE — Brief Op Note (Signed)
03/02/2021  4:45 PM  PATIENT:  Joshua Farley  50 y.o. male  PRE-OPERATIVE DIAGNOSIS:   1.  Left shoulder rotator cuff tear, acute and complete.  2.  Left shoulder proximal biceps tendinitis  3.  Left shoulder degenerative labral fraying.  4.  Left shoulder subacromial impingement    POST-OPERATIVE DIAGNOSIS:  same  PROCEDURE:  Procedure(s) with comments: 1.  Right shoulder arthroscopy with rotator cuff repair of supraspinatus complete tear. 2.  Right shoulder arthroscopy with extensive debridement of rotator interval, anterior labrum, superior labrum, and posterior labrum 3.  Right shoulder arthroscopic subacromial decompression with coracoacromial ligament release 4.  Right shoulder arthroscopic biceps tenodesis with suprapectoral technique  SURGEON:  Surgeon(s) and Role:    * Aundria Rud, Noah Delaine, MD - Primary  PHYSICIAN ASSISTANT: Dion Saucier, PA-C  ANESTHESIA:   regional and general  EBL:  10 cc  BLOOD ADMINISTERED:none  DRAINS: none   LOCAL MEDICATIONS USED:  NONE  SPECIMEN:  No Specimen  DISPOSITION OF SPECIMEN:  N/A  COUNTS:  YES  TOURNIQUET:  * No tourniquets in log *  DICTATION: .Note written in EPIC  PLAN OF CARE: Discharge to home after PACU  PATIENT DISPOSITION:  PACU - hemodynamically stable.   Delay start of Pharmacological VTE agent (>24hrs) due to surgical blood loss or risk of bleeding: not applicable

## 2021-03-02 NOTE — Anesthesia Postprocedure Evaluation (Signed)
Anesthesia Post Note  Patient: Joshua Farley  Procedure(s) Performed: Left shoulder scope, Rotator cuff repair,biceps tenodesis and subacromial decompression (Left: Shoulder)     Patient location during evaluation: PACU Anesthesia Type: General Level of consciousness: awake and alert, patient cooperative and oriented Pain management: pain level controlled Vital Signs Assessment: post-procedure vital signs reviewed and stable Respiratory status: spontaneous breathing, nonlabored ventilation and respiratory function stable Cardiovascular status: blood pressure returned to baseline, stable and bradycardic Postop Assessment: no apparent nausea or vomiting Anesthetic complications: no   No notable events documented.  Last Vitals:  Vitals:   03/02/21 1755 03/02/21 1810  BP: (!) 136/98 (!) 151/97  Pulse: (!) 48 (!) 50  Resp: 15 14  Temp:    SpO2: 95% 99%    Last Pain:  Vitals:   03/02/21 1755  TempSrc:   PainSc: 0-No pain                 Joshua Farley,E. Orby Tangen

## 2021-03-02 NOTE — Discharge Instructions (Signed)
Orthopedic discharge instructions:  -Maintain your arm in the sling at all times.  You may only remove for activities of daily living such as getting dressed and bathing.  You should then reapply the sling.  She also sleep with this in place.  No lifting with the right arm.  You may use the hand, elbow, and wrist for activities of daily living, and active range of motion.  -Apply ice to the right shoulder liberally throughout the day.  This should occur for 20 to 30 minutes out of each hour that you are awake.  -For mild to moderate pain use Tylenol and Advil around-the-clock.  Use these as directed and every 6 hours respectively.  For breakthrough pain use oxycodone as necessary.  For muscle spasms use Robaxin as necessary.  -You may remove your postoperative bandages in 3 days.  On the third day you may begin showering.  Please do not submerge your wounds underwater.  Leave your Steri-Strips in place until your follow-up appointment in 2 weeks.

## 2021-03-02 NOTE — Transfer of Care (Signed)
Immediate Anesthesia Transfer of Care Note  Patient: [facility]  Procedure(s) Performed: Left shoulder scope, Rotator cuff repair,biceps tenodesis and subacromial decompression (Left: Shoulder)  Patient Location: PACU  Anesthesia Type:GA combined with regional for post-op pain  Level of Consciousness: drowsy  Airway & Oxygen Therapy: Patient Spontanous Breathing and Patient connected to nasal cannula oxygen  Post-op Assessment: Report given to RN and Post -op Vital signs reviewed and stable  Post vital signs: Reviewed and stable  Last Vitals:  Vitals Value Taken Time  BP 166/91 03/02/21 1723  Temp    Pulse 56 03/02/21 1726  Resp 17 03/02/21 1726  SpO2 93 % 03/02/21 1726  Vitals shown include unvalidated device data.  Last Pain:  Vitals:   03/02/21 1343  TempSrc:   PainSc: 0-No pain      Patients Stated Pain Goal: 3 (03/02/21 1343)  Complications: No notable events documented.

## 2021-03-04 ENCOUNTER — Encounter (HOSPITAL_COMMUNITY): Payer: Self-pay | Admitting: Orthopedic Surgery

## 2021-08-13 ENCOUNTER — Other Ambulatory Visit: Payer: Self-pay

## 2021-08-13 ENCOUNTER — Encounter (HOSPITAL_BASED_OUTPATIENT_CLINIC_OR_DEPARTMENT_OTHER): Payer: Self-pay

## 2021-08-13 ENCOUNTER — Emergency Department (HOSPITAL_BASED_OUTPATIENT_CLINIC_OR_DEPARTMENT_OTHER)
Admission: EM | Admit: 2021-08-13 | Discharge: 2021-08-13 | Disposition: A | Discharge status: Home / Self Care | Payer: 59 | Attending: Emergency Medicine | Admitting: Emergency Medicine

## 2021-08-13 ENCOUNTER — Emergency Department (HOSPITAL_BASED_OUTPATIENT_CLINIC_OR_DEPARTMENT_OTHER): Payer: 59

## 2021-08-13 DIAGNOSIS — R059 Cough, unspecified: Secondary | ICD-10-CM | POA: Diagnosis not present

## 2021-08-13 DIAGNOSIS — I10 Essential (primary) hypertension: Secondary | ICD-10-CM | POA: Insufficient documentation

## 2021-08-13 DIAGNOSIS — Z79899 Other long term (current) drug therapy: Secondary | ICD-10-CM | POA: Insufficient documentation

## 2021-08-13 DIAGNOSIS — R079 Chest pain, unspecified: Secondary | ICD-10-CM

## 2021-08-13 DIAGNOSIS — R0789 Other chest pain: Secondary | ICD-10-CM | POA: Diagnosis present

## 2021-08-13 LAB — BASIC METABOLIC PANEL
Anion gap: 8 (ref 5–15)
BUN: 17 mg/dL (ref 6–20)
CO2: 23 mmol/L (ref 22–32)
Calcium: 8.8 mg/dL — ABNORMAL LOW (ref 8.9–10.3)
Chloride: 103 mmol/L (ref 98–111)
Creatinine, Ser: 1.15 mg/dL (ref 0.61–1.24)
GFR, Estimated: >60 mL/min (ref >60)
Glucose, Bld: 98 mg/dL (ref 70–99)
Potassium: 3.3 mmol/L — ABNORMAL LOW (ref 3.5–5.1)
Sodium: 134 mmol/L — ABNORMAL LOW (ref 135–145)

## 2021-08-13 LAB — CBC
HCT: 44.6 % (ref 39.0–52.0)
Hemoglobin: 15.2 g/dL (ref 13.0–17.0)
MCH: 29.9 pg (ref 26.0–34.0)
MCHC: 34.1 g/dL (ref 30.0–36.0)
MCV: 87.6 fL (ref 80.0–100.0)
Platelets: 392 10*3/uL (ref 150–400)
RBC: 5.09 MIL/uL (ref 4.22–5.81)
RDW: 14.7 % (ref 11.5–15.5)
WBC: 6.8 10*3/uL (ref 4.0–10.5)
nRBC: 0 % (ref 0.0–0.2)

## 2021-08-13 LAB — TROPONIN I (HIGH SENSITIVITY)
Troponin I (High Sensitivity): 8 ng/L (ref <18)
Troponin I (High Sensitivity): 7 ng/L (ref <18)

## 2021-08-13 MED ORDER — KETOROLAC TROMETHAMINE 30 MG/ML IJ SOLN
30.0000 mg | Freq: Once | INTRAMUSCULAR | Status: AC
  Administered 2021-08-13: 30 mg via INTRAVENOUS
  Filled 2021-08-13: qty 1

## 2021-08-13 MED ORDER — KETOROLAC TROMETHAMINE 30 MG/ML IJ SOLN: 30.0000 mg | Freq: Once | INTRAMUSCULAR | Status: DC

## 2021-08-13 NOTE — ED Provider Notes (Signed)
[facility] Provider Note   CSN: [id number] Arrival date & time: 08/13/21  1403     History  Chief Complaint  Patient presents with   Chest Pain    Joshua Farley is a 50 y.o. male.  He is here with complaint of 2 days of upper central chest pain sharp worse with movement and taking a deep breath.  Does not feel short of breath.  Notices it when he is driving his car and using his arms.  No trauma.  No fevers or chills.  Had some dry smoker's cough.  No abdominal pain.  No diaphoresis.  Has tried nothing for it.  No history of cardiac disease, denies cocaine  The history is provided by the patient.  Chest Pain Pain location:  Substernal area Pain quality: stabbing   Pain radiates to:  Does not radiate Pain severity:  Moderate Onset quality:  Gradual Duration:  2 days Timing:  Intermittent Progression:  Unchanged Chronicity:  New Context: not trauma   Relieved by:  None tried Worsened by:  Certain positions, deep breathing and movement Ineffective treatments:  None tried Associated symptoms: cough   Associated symptoms: no abdominal pain, no diaphoresis, no dysphagia, no fever, no headache, no nausea, no shortness of breath and no vomiting   Risk factors: hypertension and smoking       Home Medications Prior to Admission medications   Medication Sig Start Date End Date Taking? Authorizing Provider  amLODipine (NORVASC) 10 MG tablet Take 10 mg by mouth daily.    [provider]  hydrochlorothiazide (HYDRODIURIL) 25 MG tablet Take 25 mg by mouth daily.    [provider]  ipratropium (ATROVENT) 0.06 % nasal spray SMARTSIG:2 Spray(s) Both Nares Twice Daily PRN 07/22/21   [provider]  oxyCODONE (ROXICODONE) 5 MG immediate release tablet Take 1 tablet (5 mg total) by mouth every 8 (eight) hours as needed. 03/02/21 03/02/22  Nicholes Stairs, MD  pantoprazole (PROTONIX) 40 MG tablet Take 40 mg by mouth 2 (two) times  daily. 01/22/21   [provider]      Allergies    Lisinopril    Review of Systems   Review of Systems  Constitutional:  Negative for diaphoresis and fever.  HENT:  Negative for sore throat and trouble swallowing.   Eyes:  Negative for visual disturbance.  Respiratory:  Positive for cough. Negative for shortness of breath.   Cardiovascular:  Positive for chest pain.  Gastrointestinal:  Negative for abdominal pain, nausea and vomiting.  Genitourinary:  Negative for dysuria.  Musculoskeletal:  Negative for neck pain.  Skin:  Negative for rash.  Neurological:  Negative for headaches.   Physical Exam Updated Vital Signs BP 135/86 (BP Location: Left Arm)    Pulse 63    Temp 97.7 F (36.5 C) (Oral)    Resp 16    Ht 5\' 7"  (1.702 m)    Wt 95.3 kg    SpO2 99%    BMI 32.89 kg/m  Physical Exam Vitals and nursing note reviewed.  Constitutional:      General: He is not in acute distress.    Appearance: He is well-developed.  HENT:     Head: Normocephalic and atraumatic.  Eyes:     Conjunctiva/sclera: Conjunctivae normal.  Cardiovascular:     Rate and Rhythm: Normal rate and regular rhythm.     Heart sounds: Normal heart sounds. No murmur heard. Pulmonary:     Effort: Pulmonary effort is  normal. No respiratory distress.     Breath sounds: Normal breath sounds.  Chest:     Chest wall: Tenderness present. No crepitus.     Comments: He has reproducible tenderness to palpation of his upper chest. Abdominal:     Palpations: Abdomen is soft.     Tenderness: There is no abdominal tenderness.  Musculoskeletal:        General: No swelling.     Cervical back: Neck supple.     Right lower leg: No tenderness. No edema.     Left lower leg: No tenderness. No edema.  Skin:    General: Skin is warm and dry.     Capillary Refill: Capillary refill takes less than 2 seconds.  Neurological:     General: No focal deficit present.     Mental Status: He is alert.    ED Results /  Procedures / Treatments   Labs (all labs ordered are listed, but only abnormal results are displayed) Labs Reviewed  BASIC METABOLIC PANEL - Abnormal; Notable for the following components:      Result Value   Sodium 134 (*)    Potassium 3.3 (*)    Calcium 8.8 (*)    All other components within normal limits  CBC  TROPONIN I (HIGH SENSITIVITY)  TROPONIN I (HIGH SENSITIVITY)    EKG EKG Interpretation  Date/Time:  Friday August 13 2021 14:11:27 EST Ventricular Rate:  63 PR Interval:  178 QRS Duration: 90 QT Interval:  392 QTC Calculation: 401 R Axis:   84 Text Interpretation: Normal sinus rhythm Septal infarct , age undetermined Abnormal ECG When compared with ECG of 24-Dec-2020 11:33, No significant change since prior Confirmed by Aletta Edouard [id number]) on 08/13/2021 3:03:14 PM  Radiology DG Chest 2 View  Result Date: 08/13/2021 CLINICAL DATA:  Left upper chest pain for the past 2 days. EXAM: CHEST - 2 VIEW COMPARISON:  Chest x-ray dated February 13, 2015. FINDINGS: The heart size and mediastinal contours are within normal limits. Both lungs are clear. The visualized skeletal structures are unremarkable. IMPRESSION: No active cardiopulmonary disease. Electronically Signed   By: Titus Dubin M.D.   On: 08/13/2021 14:35    Procedures Procedures    Medications Ordered in ED Medications  ketorolac (TORADOL) 30 MG/ML injection 30 mg (30 mg Intravenous Given 08/13/21 1628)    ED Course/ Medical Decision Making/ A&P                           Medical Decision Making  This patient complains of upper chest pain; this involves an extensive number of treatment Options and is a complaint that carries with it a high risk of complications and Morbidity. The differential includes musculoskeletal pain, pneumonia, PE, pneumothorax, ACS, reflux  I ordered, reviewed and interpreted labs, which included CBC with normal white count normal hemoglobin, chemistries fairly normal other than  mildly low potassium, delta troponin flat I ordered medication IV Toradol with improvement in his symptoms I ordered imaging studies which included chest x-ray and I independently    visualized and interpreted imaging which showed no acute findings  Previous records obtained and reviewed in epic no recent admissions After the interventions stated above, I reevaluated the patient and found patient be asymptomatic satting well on room air in no distress.  Reviewed results of work-up with him.  Blood pressure also elevated recommended primary care follow-up.  Patient comfortable plan for outpatient follow-up.  Return instructions discussed  Final Clinical Impression(s) / ED Diagnoses Final diagnoses:  Nonspecific chest pain    Rx / DC Orders ED Discharge Orders     None         Hayden Rasmussen, MD 08/14/21 1012

## 2021-08-13 NOTE — Discharge Instructions (Signed)
You were seen in the emergency department for upper chest pain.  You had blood work EKG and a chest x-ray that did not show any evidence of heart attack.  This is likely muscular and you can try ibuprofen and some ice or heat to the area.  Return to the emergency department if any worsening or concerning symptoms.

## 2021-08-13 NOTE — ED Triage Notes (Signed)
Pt c/o central chest pain when he moves started yesterday-denies CP at present-reports +cough at night-NAD-steady gait

## 2023-03-04 IMAGING — CR DG CHEST 2V
2 series · 2 of 2 positions shown · non-contrast
Comparison: Chest x-ray dated February 13, 2015.

CLINICAL DATA: Left upper chest pain for the past 2 days.

EXAM:
CHEST - 2 VIEW

[w chest pa]
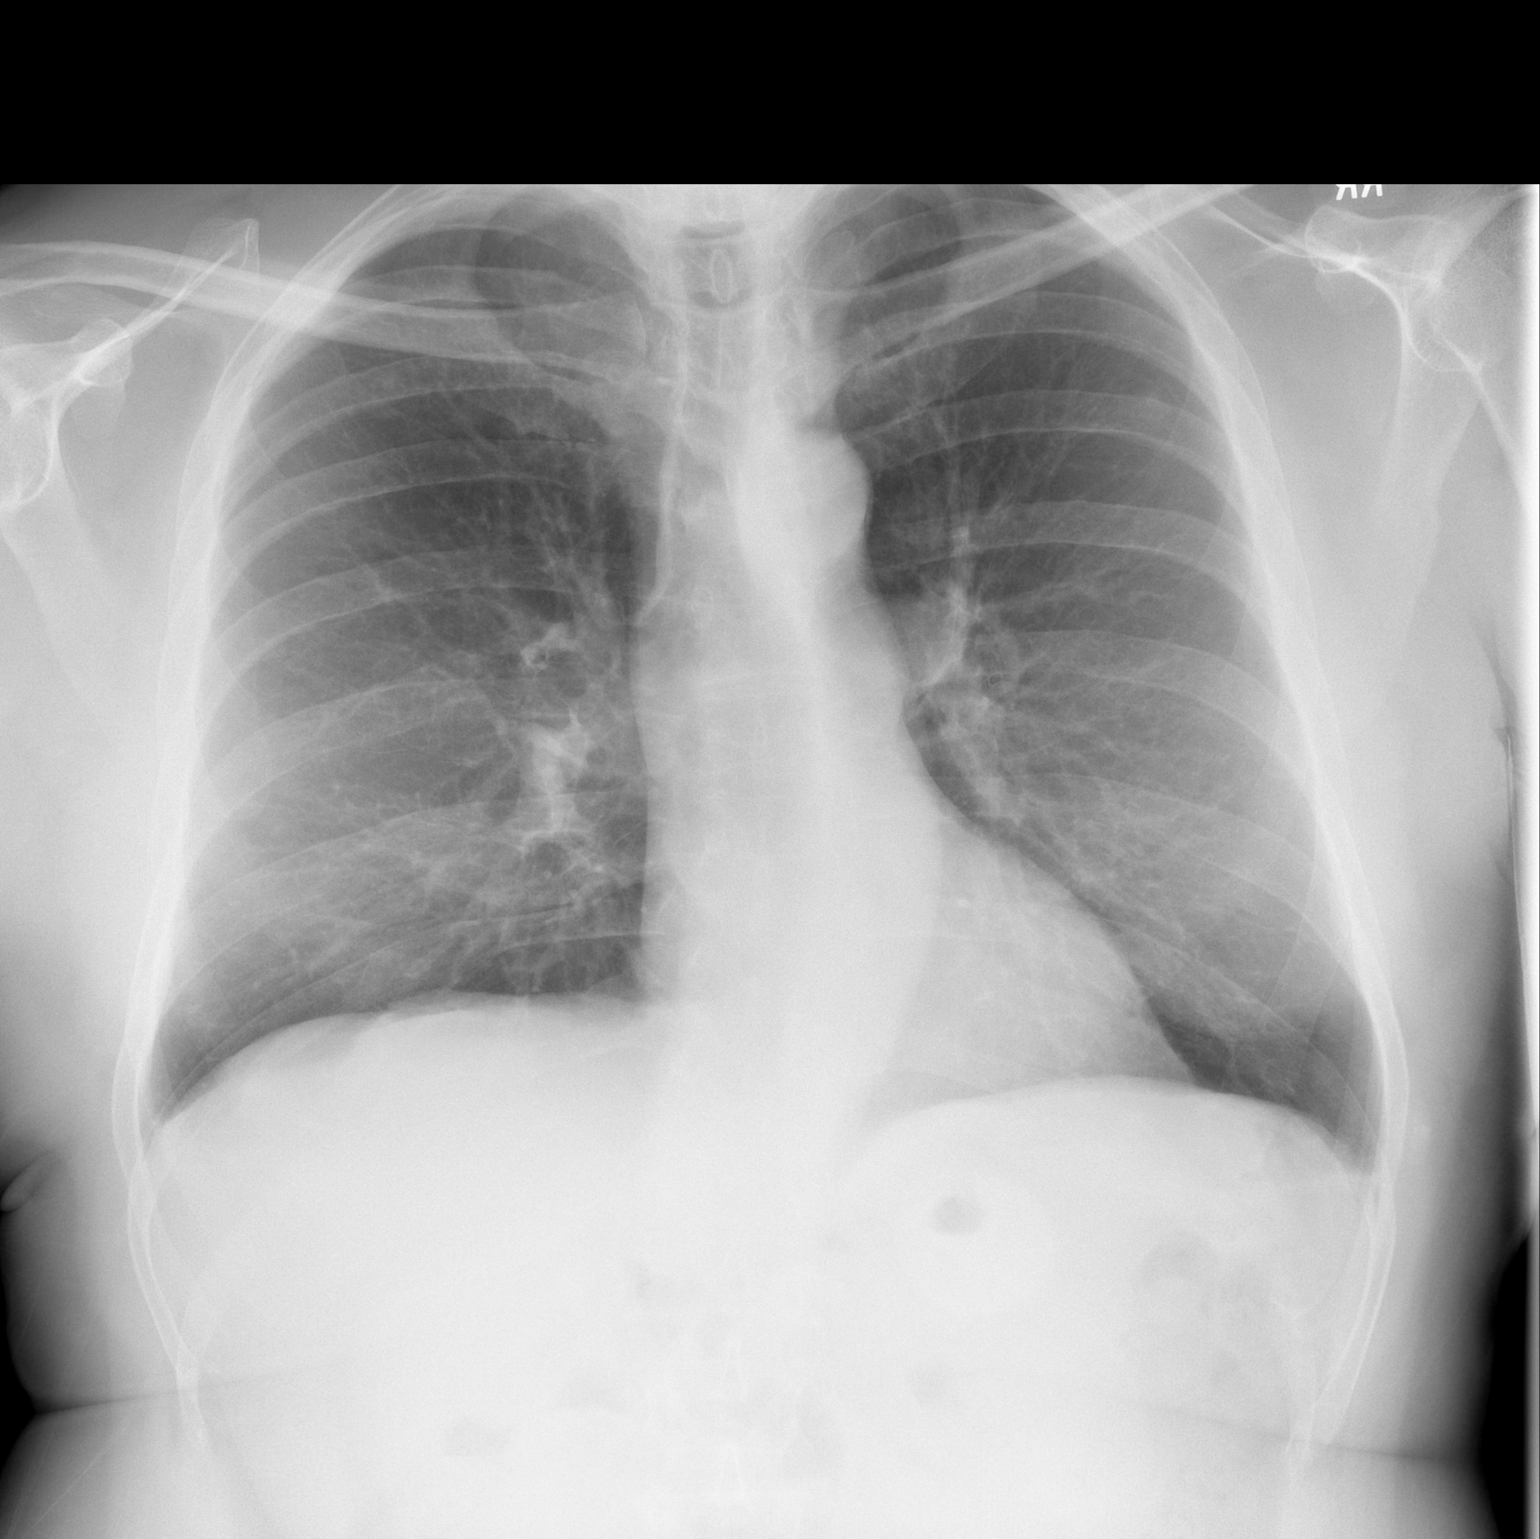

[w chest lat]
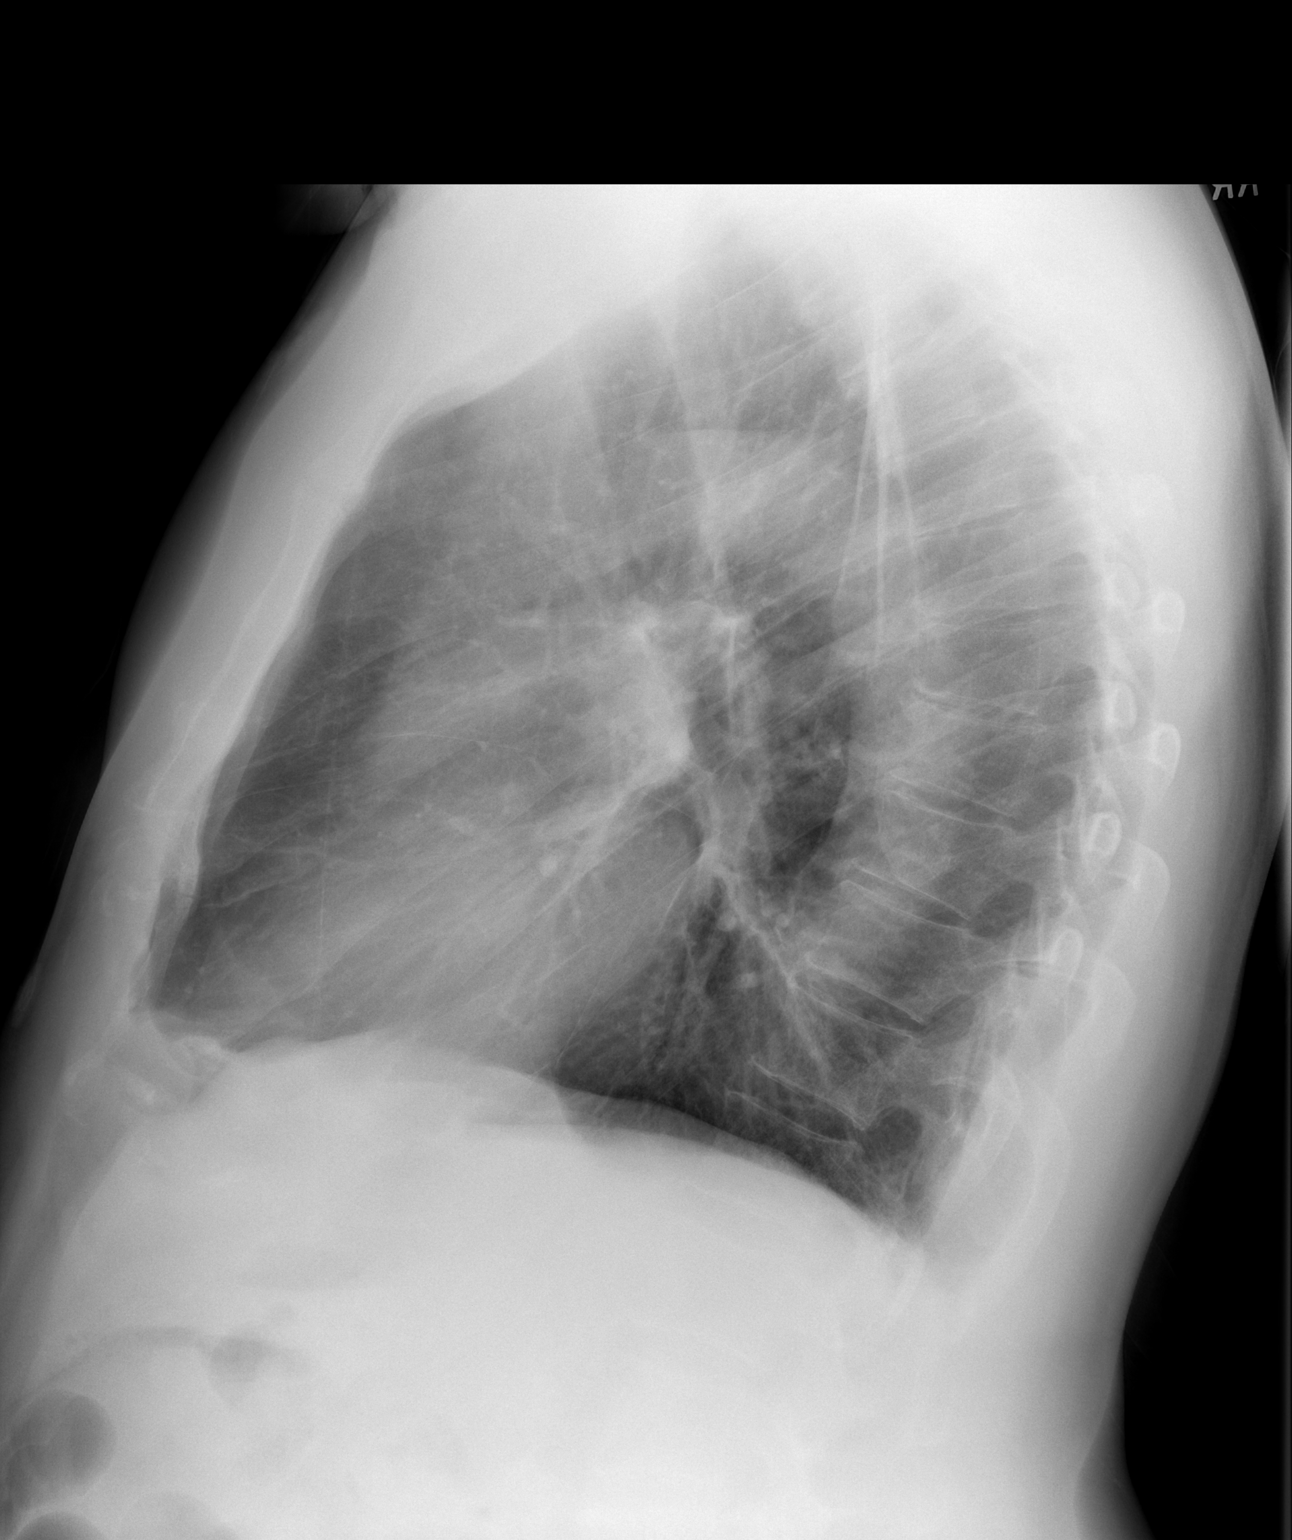

[2 of 2 positions shown; findings below may reference images not displayed]

FINDINGS: The heart size and mediastinal contours are within normal limits.
Both lungs are clear. The visualized skeletal structures are
unremarkable.
IMPRESSION: No active cardiopulmonary disease.

## 2023-06-12 ENCOUNTER — Encounter (HOSPITAL_BASED_OUTPATIENT_CLINIC_OR_DEPARTMENT_OTHER): Payer: Self-pay | Admitting: Emergency Medicine

## 2023-06-12 ENCOUNTER — Other Ambulatory Visit: Payer: Self-pay

## 2023-06-12 ENCOUNTER — Emergency Department (HOSPITAL_BASED_OUTPATIENT_CLINIC_OR_DEPARTMENT_OTHER)
Admission: EM | Admit: 2023-06-12 | Discharge: 2023-06-12 | Disposition: A | Discharge status: Home / Self Care | Payer: Self-pay | Attending: Emergency Medicine | Admitting: Emergency Medicine

## 2023-06-12 ENCOUNTER — Emergency Department (HOSPITAL_BASED_OUTPATIENT_CLINIC_OR_DEPARTMENT_OTHER): Payer: Self-pay

## 2023-06-12 DIAGNOSIS — I10 Essential (primary) hypertension: Secondary | ICD-10-CM

## 2023-06-12 DIAGNOSIS — N189 Chronic kidney disease, unspecified: Secondary | ICD-10-CM | POA: Insufficient documentation

## 2023-06-12 DIAGNOSIS — I129 Hypertensive chronic kidney disease with stage 1 through stage 4 chronic kidney disease, or unspecified chronic kidney disease: Secondary | ICD-10-CM | POA: Insufficient documentation

## 2023-06-12 DIAGNOSIS — Z20822 Contact with and (suspected) exposure to covid-19: Secondary | ICD-10-CM | POA: Insufficient documentation

## 2023-06-12 DIAGNOSIS — G43809 Other migraine, not intractable, without status migrainosus: Secondary | ICD-10-CM | POA: Insufficient documentation

## 2023-06-12 DIAGNOSIS — Z79899 Other long term (current) drug therapy: Secondary | ICD-10-CM | POA: Insufficient documentation

## 2023-06-12 LAB — CBC WITH DIFFERENTIAL/PLATELET
Abs Immature Granulocytes: 0.01 10*3/uL (ref 0.00–0.07)
Basophils Absolute: 0 10*3/uL (ref 0.0–0.1)
Basophils Relative: 1 %
Eosinophils Absolute: 0.1 10*3/uL (ref 0.0–0.5)
Eosinophils Relative: 1 %
HCT: 50.8 % (ref 39.0–52.0)
Hemoglobin: 17.7 g/dL — ABNORMAL HIGH (ref 13.0–17.0)
Immature Granulocytes: 0 %
Lymphocytes Relative: 28 %
Lymphs Abs: 1.9 10*3/uL (ref 0.7–4.0)
MCH: 30.9 pg (ref 26.0–34.0)
MCHC: 34.8 g/dL (ref 30.0–36.0)
MCV: 88.7 fL (ref 80.0–100.0)
Monocytes Absolute: 0.4 10*3/uL (ref 0.1–1.0)
Monocytes Relative: 6 %
Neutro Abs: 4.3 10*3/uL (ref 1.7–7.7)
Neutrophils Relative %: 64 %
Platelets: 426 10*3/uL — ABNORMAL HIGH (ref 150–400)
RBC: 5.73 MIL/uL (ref 4.22–5.81)
RDW: 14.6 % (ref 11.5–15.5)
WBC: 6.7 10*3/uL (ref 4.0–10.5)
nRBC: 0 % (ref 0.0–0.2)

## 2023-06-12 LAB — COMPREHENSIVE METABOLIC PANEL
ALT: 35 U/L (ref 0–44)
AST: 30 U/L (ref 15–41)
Albumin: 4.2 g/dL (ref 3.5–5.0)
Alkaline Phosphatase: 124 U/L (ref 38–126)
Anion gap: 10 (ref 5–15)
BUN: 11 mg/dL (ref 6–20)
CO2: 25 mmol/L (ref 22–32)
Calcium: 9.2 mg/dL (ref 8.9–10.3)
Chloride: 97 mmol/L — ABNORMAL LOW (ref 98–111)
Creatinine, Ser: 1.07 mg/dL (ref 0.61–1.24)
GFR, Estimated: >60 mL/min (ref >60)
Glucose, Bld: 119 mg/dL — ABNORMAL HIGH (ref 70–99)
Potassium: 3.3 mmol/L — ABNORMAL LOW (ref 3.5–5.1)
Sodium: 132 mmol/L — ABNORMAL LOW (ref 135–145)
Total Bilirubin: 0.8 mg/dL (ref <1.2)
Total Protein: 8.3 g/dL — ABNORMAL HIGH (ref 6.5–8.1)

## 2023-06-12 LAB — TROPONIN I (HIGH SENSITIVITY): Troponin I (High Sensitivity): 6 ng/L (ref <18)

## 2023-06-12 LAB — RESP PANEL BY RT-PCR (RSV, FLU A&B, COVID)  RVPGX2
Influenza A by PCR: NEGATIVE
Influenza B by PCR: NEGATIVE
Resp Syncytial Virus by PCR: NEGATIVE
SARS Coronavirus 2 by RT PCR: NEGATIVE

## 2023-06-12 MED ORDER — KETOROLAC TROMETHAMINE 15 MG/ML IJ SOLN
15.0000 mg | Freq: Once | INTRAMUSCULAR | Status: AC
  Administered 2023-06-12: 15 mg via INTRAVENOUS
  Filled 2023-06-12: qty 1

## 2023-06-12 MED ORDER — MAGNESIUM SULFATE 2 GM/50ML IV SOLN
2.0000 g | Freq: Once | INTRAVENOUS | Status: AC
  Administered 2023-06-12: 2 g via INTRAVENOUS
  Filled 2023-06-12: qty 50

## 2023-06-12 MED ORDER — SODIUM CHLORIDE 0.9 % IV BOLUS
1000.0000 mL | Freq: Once | INTRAVENOUS | Status: AC
  Administered 2023-06-12: 1000 mL via INTRAVENOUS

## 2023-06-12 MED ORDER — PROCHLORPERAZINE EDISYLATE 10 MG/2ML IJ SOLN
10.0000 mg | Freq: Once | INTRAMUSCULAR | Status: AC
  Administered 2023-06-12: 10 mg via INTRAVENOUS
  Filled 2023-06-12: qty 2

## 2023-06-12 MED ORDER — DIPHENHYDRAMINE HCL 50 MG/ML IJ SOLN
25.0000 mg | Freq: Once | INTRAMUSCULAR | Status: AC
Start: 2023-06-12 — End: 2023-06-12
  Administered 2023-06-12: 25 mg via INTRAVENOUS
  Filled 2023-06-12: qty 1

## 2023-06-12 NOTE — ED Triage Notes (Signed)
Rt sided h/a Headache since yesterday am states has taken his HP meds has had a cough x 3 weeks  denies n/v/d

## 2023-06-12 NOTE — ED Provider Notes (Signed)
[place] [facility] AT [facility] Provider Note   CSN: [id number] Arrival date & time: 06/12/23  1310     History  Chief Complaint  Patient presents with   Headache    Joshua Farley is a 52 y.o. male with past medical history significant for hypertension, GERD, CKD who presents with concern for headache that started gradually since yesterday morning associated with light sensitivity, he endorsed some mild nausea which is since resolved, he reports some ongoing light sensitivity.  He reports that the headache is on the right side of his face centered around his eye.  He denies any fever, chills, does report that he has been having some congestion and nasal drainage for the last few weeks.  He denies any numbness, tingling, he reports that he occasionally has an abnormal sensation in his right foot.  He also endorses some new dark spots on his eyes but he is not sure how long they have been there.  Patient has been taking his home blood pressure medication amlodipine, hydrochlorothiazide without any missed doses.  He reports that his blood pressure was 158/101 at home.Marland Kitchen   Headache      Home Medications Prior to Admission medications   Medication Sig Start Date End Date Taking? Authorizing Provider  amLODipine (NORVASC) 10 MG tablet Take 10 mg by mouth daily.    [provider]  hydrochlorothiazide (HYDRODIURIL) 25 MG tablet Take 25 mg by mouth daily.    [provider]  ipratropium (ATROVENT) 0.06 % nasal spray SMARTSIG:2 Spray(s) Both Nares Twice Daily PRN 07/22/21   [provider]  pantoprazole (PROTONIX) 40 MG tablet Take 40 mg by mouth 2 (two) times daily. 01/22/21   [provider]      Allergies    Lisinopril    Review of Systems   Review of Systems  Neurological:  Positive for headaches.  All other systems reviewed and are negative.   Physical Exam Updated Vital Signs BP (!) 165/85 (BP Location: Right Arm)    Pulse (!) 53   Temp 97.7 F (36.5 C) (Oral)   Resp 15   Ht 6\' 7"  (2.007 m)   Wt 93 kg   SpO2 95%   BMI 23.09 kg/m  Physical Exam Vitals and nursing note reviewed.  Constitutional:      General: He is not in acute distress.    Appearance: Normal appearance.  HENT:     Head: Normocephalic and atraumatic.  Eyes:     General:        Right eye: No discharge.        Left eye: No discharge.  Cardiovascular:     Rate and Rhythm: Normal rate and regular rhythm.     Heart sounds: No murmur heard.    No friction rub. No gallop.  Pulmonary:     Effort: Pulmonary effort is normal.     Breath sounds: Normal breath sounds.  Abdominal:     General: Bowel sounds are normal.     Palpations: Abdomen is soft.  Skin:    General: Skin is warm and dry.     Capillary Refill: Capillary refill takes less than 2 seconds.     Comments: Nail clubbing noted on the bilateral hands  Neurological:     Mental Status: He is alert and oriented to person, place, and time.     Comments: Cranial nerves II through XII grossly intact.  Intact finger-nose, intact heel-to-shin.  Romberg negative, gait normal.  Alert  and oriented x3.  Moves all 4 limbs spontaneously, normal coordination.  No pronator drift.  Intact strength 5 out of 5 bilateral upper and lower extremities.    Psychiatric:        Mood and Affect: Mood normal.        Behavior: Behavior normal.     ED Results / Procedures / Treatments   Labs (all labs ordered are listed, but only abnormal results are displayed) Labs Reviewed  CBC WITH DIFFERENTIAL/PLATELET - Abnormal; Notable for the following components:      Result Value   Hemoglobin 17.7 (*)    Platelets 426 (*)    All other components within normal limits  COMPREHENSIVE METABOLIC PANEL - Abnormal; Notable for the following components:   Sodium 132 (*)    Potassium 3.3 (*)    Chloride 97 (*)    Glucose, Bld 119 (*)    Total Protein 8.3 (*)    All other components within normal  limits  RESP PANEL BY RT-PCR (RSV, FLU A&B, COVID)  [id number]  TROPONIN I (HIGH SENSITIVITY)    EKG EKG Interpretation Date/Time:  Monday June 12 2023 13:18:56 EST Ventricular Rate:  69 PR Interval:  162 QRS Duration:  92 QT Interval:  394 QTC Calculation: 422 R Axis:   104  Text Interpretation: Normal sinus rhythm Rightward axis Nonspecific T wave abnormality Abnormal ECG When compared with ECG of 13-Aug-2021 14:11, PREVIOUS ECG IS PRESENT Confirmed by Virgina Norfolk [phone]) on 06/12/2023 1:29:53 PM  Radiology CT Head Wo Contrast  Result Date: 06/12/2023 CLINICAL DATA:  Neuro deficit, acute, stroke suspected. Right-sided headache. EXAM: CT HEAD WITHOUT CONTRAST TECHNIQUE: Contiguous axial images were obtained from the base of the skull through the vertex without intravenous contrast. RADIATION DOSE REDUCTION: This exam was performed according to the departmental dose-optimization program which includes automated exposure control, adjustment of the mA and/or kV according to patient size and/or use of iterative reconstruction technique. COMPARISON:  None Available. FINDINGS: Brain: There is no evidence of an acute infarct, intracranial hemorrhage, mass, midline shift, or extra-axial fluid collection. Cerebral white matter hypodensities are nonspecific but compatible with mild chronic small vessel ischemic disease. The ventricles and sulci are normal. Vascular: No hyperdense vessel. Skull: No acute fracture or suspicious osseous lesion. Sinuses/Orbits: The included paranasal sinuses and mastoid air cells are essentially clear. Unremarkable orbits. Other: None. IMPRESSION: 1. No evidence of acute intracranial abnormality. 2. Mild chronic small vessel ischemic disease. Electronically Signed   By: Sebastian Ache M.D.   On: 06/12/2023 18:39   DG Chest Portable 1 View  Result Date: 06/12/2023 CLINICAL DATA:  Cough and chest pain EXAM: PORTABLE CHEST 1 VIEW COMPARISON:  08/13/2021 FINDINGS: No  consolidation, pneumothorax or effusion. No edema. Normal cardiopericardial silhouette. Degenerative changes of the spine. Slight eventration of the right hemidiaphragm. IMPRESSION: No acute cardiopulmonary disease. Electronically Signed   By: Karen Kays M.D.   On: 06/12/2023 13:50    Procedures Procedures    Medications Ordered in ED Medications  sodium chloride 0.9 % bolus 1,000 mL (0 mLs Intravenous Stopped 06/12/23 1724)  ketorolac (TORADOL) 15 MG/ML injection 15 mg (15 mg Intravenous Given 06/12/23 1521)  magnesium sulfate IVPB 2 g 50 mL (0 g Intravenous Stopped 06/12/23 1608)  diphenhydrAMINE (BENADRYL) injection 25 mg (25 mg Intravenous Given 06/12/23 1520)  prochlorperazine (COMPAZINE) injection 10 mg (10 mg Intravenous Given 06/12/23 1519)    ED Course/ Medical Decision Making/ A&P  Medical Decision Making Amount and/or Complexity of Data Reviewed Labs: ordered. Radiology: ordered.  Risk Prescription drug management.   Medical Decision Making:   Mekhi Rayer is a 52 y.o. male who presented to the ED today with headache, elevated BP as detailed above.    External chart has been reviewed including outpatient orthopedics, remote lab work, imaging from previous ED visits. Patient's presentation is complicated by their history of gerd, tobacco abuse.  Complete initial physical exam performed, notably the patient  was hypertensive, blood pressure 145/98 on arrival, systolic max 165, diastolic max 99.  Afebrile in the ED, normal heart rate and rhythm.  Stable oxygen saturation on room air.    Reviewed and confirmed nursing documentation for past medical history, family history, social history.    Initial Assessment:   With the patient's presentation of elevated blood pressure readings, most likely diagnosis is hypertensive urgency. Other diagnoses associated with hypertensive emergency were considered including (but not limited to) intracranial  hemorrhage, acute renal artery stenosis, acute kidney injury, myocardial stress, ophthalmologic emergencies. These are considered less likely due to history of present illness and physical exam findings.   This is most consistent with an acute life/limb threatening illness complicated by underlying chronic conditions. Will evaluate for hypertensive emergency as below.  Initial Plan:   Screening labs including CBC and Metabolic panel to evaluate for infectious or metabolic etiology of disease.  CXR to evaluate for structural/infectious intrathoracic pathology.  Given headache, eval for ICH with CTH EKG to evaluate for cardiac pathology. Objective evaluation as below reviewed. Considered further administration of antihypertensives in ED, per consensus guidelines for [facility] of emergency physicians, acute treatment of hypertensive urgency alone in the emergency department is not recommended.  If patient has evidence of hypertensive emergency on objective laboratory evaluation, will reevaluate.  Will monitor blood pressure while patient awaiting above laboratory studies.  Initial Study Results:   Laboratory  All laboratory results reviewed without evidence of clinically relevant pathology.   Exceptions include: Mild hypokalemia, testing 3.3, we will orally replete, sodium 132, will administer fluid bolus as part of migraine cocktail  EKG EKG was reviewed independently. Rate, rhythm, axis, intervals all examined and without medically relevant abnormality. ST segments without concerns for elevations.  No significant change from last baseline  Radiology:  All images reviewed independently.  No evidence of acute intra thoracic abnormality.  Agree with radiology report at this time.   CT Head Wo Contrast  Result Date: 06/12/2023 CLINICAL DATA:  Neuro deficit, acute, stroke suspected. Right-sided headache. EXAM: CT HEAD WITHOUT CONTRAST TECHNIQUE: Contiguous axial images were obtained from the  base of the skull through the vertex without intravenous contrast. RADIATION DOSE REDUCTION: This exam was performed according to the departmental dose-optimization program which includes automated exposure control, adjustment of the mA and/or kV according to patient size and/or use of iterative reconstruction technique. COMPARISON:  None Available. FINDINGS: Brain: There is no evidence of an acute infarct, intracranial hemorrhage, mass, midline shift, or extra-axial fluid collection. Cerebral white matter hypodensities are nonspecific but compatible with mild chronic small vessel ischemic disease. The ventricles and sulci are normal. Vascular: No hyperdense vessel. Skull: No acute fracture or suspicious osseous lesion. Sinuses/Orbits: The included paranasal sinuses and mastoid air cells are essentially clear. Unremarkable orbits. Other: None. IMPRESSION: 1. No evidence of acute intracranial abnormality. 2. Mild chronic small vessel ischemic disease. Electronically Signed   By: Sebastian Ache M.D.   On: 06/12/2023 18:39   DG Chest Portable 1  View  Result Date: 06/12/2023 CLINICAL DATA:  Cough and chest pain EXAM: PORTABLE CHEST 1 VIEW COMPARISON:  08/13/2021 FINDINGS: No consolidation, pneumothorax or effusion. No edema. Normal cardiopericardial silhouette. Degenerative changes of the spine. Slight eventration of the right hemidiaphragm. IMPRESSION: No acute cardiopulmonary disease. Electronically Signed   By: Karen Kays M.D.   On: 06/12/2023 13:50    Patient headache improved after migraine cocktail, discussed changing his blood pressure medication as his diastolic blood pressure has remained elevated in the ED, around high 90s, with MAP around 115, blood pressure at time of discharge 146/96.  Patient declines changing blood pressure medication today and will follow-up closely with PCP, I think this is reasonable with no evidence of endorgan damage.  Reassessment and Plan:   Patient stable for discharge  after improvement of migraine with migraine cocktail, he does have poorly controlled hypertension, but no evidence of hypertensive emergency today.  Final Clinical Impression(s) / ED Diagnoses Final diagnoses:  Hypertension, unspecified type  Other migraine without status migrainosus, not intractable    Rx / DC Orders ED Discharge Orders     None         Olene Floss, PA-C 06/12/23 1856    Virgina Norfolk, DO 06/13/23 0805

## 2023-06-12 NOTE — ED Notes (Signed)
Pt complaining that IV is causing burning in his upper arm, will remove and have another RN attempt a new IV.

## 2023-06-12 NOTE — Discharge Instructions (Signed)
Please follow-up closely with your primary care doctor to discuss changing your blood pressure medications, you may want to drink plenty of fluids over the next few days to prevent return of your headache.  You can use ibuprofen, Tylenol for any remaining mild headache.  Please return to the ED if you are blood pressure becomes severely elevated, systolic (top number) greater than 180, diastolic (bottom number) greater than 110, especially if you are having numbness, tingling, vision changes, chest pain.
